# Patient Record
Sex: Female | Born: 1975 | Race: Black or African American | Hispanic: No | Marital: Single | Smoking: Current every day smoker
Health system: Southern US, Community
[De-identification: ages and names within clinical notes are randomized; demographics above are authoritative.]

## PROBLEM LIST (undated history)

## (undated) DIAGNOSIS — D649 Anemia, unspecified: Secondary | ICD-10-CM

## (undated) DIAGNOSIS — F329 Major depressive disorder, single episode, unspecified: Secondary | ICD-10-CM

## (undated) DIAGNOSIS — K5792 Diverticulitis of intestine, part unspecified, without perforation or abscess without bleeding: Secondary | ICD-10-CM

## (undated) DIAGNOSIS — F419 Anxiety disorder, unspecified: Secondary | ICD-10-CM

## (undated) DIAGNOSIS — M797 Fibromyalgia: Secondary | ICD-10-CM

## (undated) DIAGNOSIS — M1990 Unspecified osteoarthritis, unspecified site: Secondary | ICD-10-CM

## (undated) DIAGNOSIS — D219 Benign neoplasm of connective and other soft tissue, unspecified: Secondary | ICD-10-CM

## (undated) DIAGNOSIS — R112 Nausea with vomiting, unspecified: Secondary | ICD-10-CM

## (undated) DIAGNOSIS — R0600 Dyspnea, unspecified: Secondary | ICD-10-CM

## (undated) DIAGNOSIS — J45909 Unspecified asthma, uncomplicated: Secondary | ICD-10-CM

## (undated) DIAGNOSIS — F32A Depression, unspecified: Secondary | ICD-10-CM

## (undated) DIAGNOSIS — K219 Gastro-esophageal reflux disease without esophagitis: Secondary | ICD-10-CM

## (undated) DIAGNOSIS — Z9889 Other specified postprocedural states: Secondary | ICD-10-CM

## (undated) HISTORY — PX: CHOLECYSTECTOMY: SHX55

---
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
## 2017-04-16 ENCOUNTER — Other Ambulatory Visit: Payer: Self-pay | Admitting: *Deleted

## 2017-04-16 DIAGNOSIS — Z1231 Encounter for screening mammogram for malignant neoplasm of breast: Secondary | ICD-10-CM

## 2017-06-29 ENCOUNTER — Other Ambulatory Visit: Payer: Self-pay

## 2017-06-29 ENCOUNTER — Emergency Department (HOSPITAL_COMMUNITY)
Admission: EM | Admit: 2017-06-29 | Discharge: 2017-06-29 | Disposition: A | Discharge status: Home / Self Care | Payer: Self-pay | Attending: Emergency Medicine | Admitting: Emergency Medicine

## 2017-06-29 ENCOUNTER — Encounter (HOSPITAL_COMMUNITY): Payer: Self-pay | Admitting: Emergency Medicine

## 2017-06-29 ENCOUNTER — Emergency Department (HOSPITAL_COMMUNITY): Payer: Self-pay

## 2017-06-29 ENCOUNTER — Emergency Department (HOSPITAL_BASED_OUTPATIENT_CLINIC_OR_DEPARTMENT_OTHER): Admit: 2017-06-29 | Discharge: 2017-06-29 | Disposition: A | Discharge status: Home / Self Care | Payer: Self-pay

## 2017-06-29 DIAGNOSIS — M79604 Pain in right leg: Secondary | ICD-10-CM

## 2017-06-29 DIAGNOSIS — F17210 Nicotine dependence, cigarettes, uncomplicated: Secondary | ICD-10-CM | POA: Insufficient documentation

## 2017-06-29 DIAGNOSIS — J45909 Unspecified asthma, uncomplicated: Secondary | ICD-10-CM | POA: Insufficient documentation

## 2017-06-29 DIAGNOSIS — R52 Pain, unspecified: Secondary | ICD-10-CM

## 2017-06-29 DIAGNOSIS — M79609 Pain in unspecified limb: Secondary | ICD-10-CM

## 2017-06-29 HISTORY — DX: Diverticulitis of intestine, part unspecified, without perforation or abscess without bleeding: K57.92

## 2017-06-29 HISTORY — DX: Benign neoplasm of connective and other soft tissue, unspecified: D21.9

## 2017-06-29 HISTORY — DX: Anemia, unspecified: D64.9

## 2017-06-29 HISTORY — DX: Unspecified asthma, uncomplicated: J45.909

## 2017-06-29 HISTORY — DX: Gastro-esophageal reflux disease without esophagitis: K21.9

## 2017-06-29 LAB — CBC WITH DIFFERENTIAL/PLATELET
BASOS ABS: 0 10*3/uL (ref 0.0–0.1)
BASOS PCT: 1 %
Eosinophils Absolute: 0.1 10*3/uL (ref 0.0–0.7)
Eosinophils Relative: 2 %
HEMATOCRIT: 32.3 % — AB (ref 36.0–46.0)
Hemoglobin: 10.3 g/dL — ABNORMAL LOW (ref 12.0–15.0)
Lymphocytes Relative: 43 %
Lymphs Abs: 2.1 10*3/uL (ref 0.7–4.0)
MCH: 28.4 pg (ref 26.0–34.0)
MCHC: 31.9 g/dL (ref 30.0–36.0)
MCV: 89 fL (ref 78.0–100.0)
MONO ABS: 0.5 10*3/uL (ref 0.1–1.0)
Monocytes Relative: 10 %
NEUTROS ABS: 2.1 10*3/uL (ref 1.7–7.7)
NEUTROS PCT: 44 %
Platelets: 431 10*3/uL — ABNORMAL HIGH (ref 150–400)
RBC: 3.63 MIL/uL — ABNORMAL LOW (ref 3.87–5.11)
RDW: 17 % — AB (ref 11.5–15.5)
WBC: 4.9 10*3/uL (ref 4.0–10.5)

## 2017-06-29 LAB — BASIC METABOLIC PANEL
ANION GAP: 7 (ref 5–15)
BUN: 8 mg/dL (ref 6–20)
CALCIUM: 8.6 mg/dL — AB (ref 8.9–10.3)
CO2: 23 mmol/L (ref 22–32)
Chloride: 109 mmol/L (ref 101–111)
Creatinine, Ser: 0.58 mg/dL (ref 0.44–1.00)
GFR calc Af Amer: >60 mL/min (ref >60)
Glucose, Bld: 102 mg/dL — ABNORMAL HIGH (ref 65–99)
Potassium: 3.8 mmol/L (ref 3.5–5.1)
SODIUM: 139 mmol/L (ref 135–145)

## 2017-06-29 MED ORDER — METHOCARBAMOL 500 MG PO TABS: 500.0000 mg | ORAL_TABLET | Freq: Two times a day (BID) | ORAL | 0 refills | Status: DC

## 2017-06-29 NOTE — ED Notes (Signed)
Patient returned from XRay

## 2017-06-29 NOTE — ED Triage Notes (Signed)
Pt reports for the last 4-5 months has been having right leg pain and weakness. Pt states her leg has been giving out. Points to the thigh area as painful. Pt also c/o left leg pain. As would like her reflux medicine refilled. Pt ambulatory NAD.

## 2017-06-29 NOTE — ED Provider Notes (Signed)
[facility] EMERGENCY DEPARTMENT Provider Note   CSN: [id number] Arrival date & time: 06/29/17  0626     History   Chief Complaint Chief Complaint  Patient presents with  . Leg Pain    HPI Jessica Gallagher is a 42 y.o. female with PMH/o anemia, asthma, diverticulitis who presents for evaluation of right lower extremity pain that has been ongoing for the last 5 months.  She denies any preceding trauma, injury, fall.  Patient reports it is a sharp shooting pain down the anterior aspect of her right lower extremity.  She reports that most notably the pain is noted to the right anterior thigh. atient reports that she felt a small bump to that area over the last month. She reports that she has been able to work without any difficulty.  Patient reports that she is not taking any medications for the pain.  Patient reports that she has been able to walk without any difficulty.  Patient states that pain is worsened in severity and frequency over the last month, prompting ED visit.  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  Patient reports that at onset,  She had some numbness to her right foot but that improved. Denies any numbness now.  She has not had any overlying warmth, erythema.  Patient reports that she will intermittently also have pain in the left lower extremity but states that that is not bothering her as much today. Denies fevers, weight loss, numbness/weakness of upper and lower extremities, bowel/bladder incontinence, saddle anesthesia, history of back surgery, history of IVDA.  She denies any OCP use, recent immobilization, prior history of DVT/PE, recent surgery, leg swelling, or long travel.  The history is provided by the patient.    Past Medical History:  Diagnosis Date  . Anemia   . Asthma   . Diverticulitis   . Fibroid tumor   . GERD (gastroesophageal reflux disease)     There are no active  problems to display for this patient.   History reviewed. No pertinent surgical history.  OB History    No data available       Home Medications    Prior to Admission medications   Medication Sig Start Date End Date Taking? Authorizing Provider  methocarbamol (ROBAXIN) 500 MG tablet Take 1 tablet (500 mg total) by mouth 2 (two) times daily. 06/29/17   Volanda Napoleon, PA-C    Family History History reviewed. No pertinent family history.  Social History Social History   Tobacco Use  . Smoking status: Current Every Day Smoker    Packs/day: 0.50    Types: Cigarettes  Substance Use Topics  . Alcohol use: Yes    Comment: occasional  . Drug use: No     Allergies   Patient has no known allergies.   Review of Systems Review of Systems  Constitutional: Negative for chills and fever.  Respiratory: Negative for cough and shortness of breath.   Cardiovascular: Negative for chest pain.  Gastrointestinal: Negative for abdominal pain, diarrhea, nausea and vomiting.  Genitourinary: Negative for dysuria and hematuria.  Musculoskeletal: Negative for back pain and neck pain.       RLE pain  Skin: Negative for color change and rash.  Neurological: Negative for dizziness, weakness, numbness and headaches.  Psychiatric/Behavioral: Negative for confusion.     Physical Exam Updated Vital Signs BP 118/78 (BP Location: Right Arm)   Pulse 76  Temp 98.3 F (36.8 C) (Oral)   Resp 18   Ht 5\' 9"  (1.753 m)   Wt 86.2 kg (190 lb)   LMP 06/28/2017   SpO2 100%   BMI 28.06 kg/m   Physical Exam  Constitutional: She is oriented to person, place, and time. She appears well-developed and well-nourished.  HENT:  Head: Normocephalic and atraumatic.  Mouth/Throat: Oropharynx is clear and moist and mucous membranes are normal.  Eyes: Conjunctivae, EOM and lids are normal. Pupils are equal, round, and reactive to light.  Neck: Full passive range of motion without pain.  Cardiovascular:  Normal rate, regular rhythm, normal heart sounds and normal pulses. Exam reveals no gallop and no friction rub.  No murmur heard. Pulses:      Dorsalis pedis pulses are 2+ on the right side, and 2+ on the left side.  Pulmonary/Chest: Effort normal and breath sounds normal.  Abdominal: Soft. Normal appearance. There is no tenderness. There is no rigidity and no guarding.  Musculoskeletal: Normal range of motion.  Right lower extremity pain tenderness palpation overlying the anterior aspect of the right thigh.  No deformity or crepitus noted.  No palpable mass noted.  Flexion/extension of RLE intact without difficulty.  Some pain with internal and external rotation.  No abnormalities of the left lower extremity.  Neurological: She is alert and oriented to person, place, and time.  Follows commands, Moves all extremities  5/5 strength to BUE and BLE  Sensation intact throughout all major nerve distributions  Skin: Skin is warm and dry. Capillary refill takes less than 2 seconds.  Good distal cap refill.  RLE is not dusky in appearance or cool to touch.  Psychiatric: She has a normal mood and affect. Her speech is normal.  Nursing note and vitals reviewed.    ED Treatments / Results  Labs (all labs ordered are listed, but only abnormal results are displayed) Labs Reviewed  BASIC METABOLIC PANEL - Abnormal; Notable for the following components:      Result Value   Glucose, Bld 102 (*)    Calcium 8.6 (*)    All other components within normal limits  CBC WITH DIFFERENTIAL/PLATELET - Abnormal; Notable for the following components:   RBC 3.63 (*)    Hemoglobin 10.3 (*)    HCT 32.3 (*)    RDW 17.0 (*)    Platelets 431 (*)    All other components within normal limits  I-STAT BETA HCG BLOOD, ED ([facility], [facility], [facility] ONLY)    EKG  EKG Interpretation None       Radiology Dg Pelvis 1-2 Views  Result Date: 06/29/2017 CLINICAL DATA:  Shooting RIGHT hip pains down RIGHT leg with occasional RIGHT  leg numbness for 5 months EXAM: PELVIS - 1-2 VIEW COMPARISON:  None FINDINGS: Hip and SI joints symmetric and preserved. Mild subchondral cyst formation at the acetabular roof. No acute fracture, dislocation or additional bone destruction. Scattered pelvic phleboliths. IMPRESSION: Mild degenerative changes at the RIGHT hip joint. No acute bony abnormalities. Electronically Signed   By: Lavonia Dana M.D.   On: 06/29/2017 10:32   Dg Femur Min 2 Views Right  Result Date: 06/29/2017 CLINICAL DATA:  RIGHT hip joint pain with shooting pains down RIGHT leg with occasional RIGHT leg numbness for 5 months EXAM: RIGHT FEMUR 2 VIEWS COMPARISON:  None FINDINGS: Mild degenerative changes at RIGHT hip joint with small subchondral cysts at acetabular roof. Osseous mineralization otherwise normal. Knee joint alignment normal. No acute fracture, dislocation or  additional bone destruction. IMPRESSION: Mild degenerative changes at RIGHT hip joint. No RIGHT femoral abnormalities identified. Electronically Signed   By: Lavonia Dana M.D.   On: 06/29/2017 10:33    Procedures Procedures (including critical care time)  Medications Ordered in ED Medications - No data to display   Initial Impression / Assessment and Plan / ED Course  I have reviewed the triage vital signs and the nursing notes.  Pertinent labs & imaging results that were available during my care of the patient were reviewed by me and considered in my medical decision making (see chart for details).     42 y.o. F who presents for evaluation of RLE pain x 5 months. No redness/swelling. No preceding trauma, injury. Feels like there is a small bump on leg. Has been able to ambulate without difficulty. Patient is afebrile, non-toxic apearing, sitting comfortably on examination table. Vital signs reviewed and stable. No neuro deficits noted on exam. Patient is neurovascularly intact. Patient with tendenress to palpation to anterior thigh. No palpable mass noted.  No evidence of abscess. Consider muscular pain vs fracture vs dislocation vs arthritis. History/physical exam is not concerning for DVT, septic arthritis, acute arterial embolism. Plan to check XR, U/S for eval thrombophleblitis.   XR reviewed. There is mention of arthritis in hip which could be contributing to patient's pain.  Otherwise no acute bony abnormality.  Ultrasound reviewed.  Negative for any superficial thrombophlebitis, DVT.  Labs were unremarkable.  Discussed results with patient.  Patient has been able to ambulate in the department without any difficulty.  Patient still concerned that there may be a small bump to her leg.  On reexamination, there is a small tiny bump.  Does not appear to be abscess.  Unsure if this is a small lipoma or if a small area of inflammation.  Given reassuring workup, patient can follow-up with outpatient regarding further concerns.  Instructed patient to take NSAIDs for pain relief.  Encourage patient to follow-up with her primary care doctor. Patient had ample opportunity for questions and discussion. All patient's questions were answered with full understanding. Strict return precautions discussed. Patient expresses understanding and agreement to plan.    Final Clinical Impressions(s) / ED Diagnoses   Final diagnoses:  Right leg pain    ED Discharge Orders        Ordered    methocarbamol (ROBAXIN) 500 MG tablet  2 times daily     06/29/17 1250       Desma Mcgregor 06/30/17 2349    Gareth Morgan, MD 07/01/17 1310

## 2017-06-29 NOTE — Discharge Instructions (Addendum)
You can take Tylenol or Ibuprofen as directed for pain. You can alternate Tylenol and Ibuprofen every 4 hours. If you take Tylenol at 1pm, then you can take Ibuprofen at 5pm. Then you can take Tylenol again at 9pm.   Take Robaxin as prescribed. This medication will make you drowsy so do not drive or drink alcohol when taking it.  You can apply ice to the affected area.   We discussed, you need to follow-up with primary care doctor.  You can follow-up with [facility] for further evaluation.  Return to the emergency department for any worsening pain, redness or swelling of the leg, fever, difficulty breathing, difficulty moving the leg, numbness in the leg, difficulty walking or any other worsening or concerning symptoms.   As we discussed, you need to follow-up with the [facility]   As we discussed, you need to follow-up with your primary care doctor.  I have arran

## 2017-06-29 NOTE — Progress Notes (Signed)
Preliminary results by tech - Venous Duplex Lower Ext. Completed. Negative for deep and superficial vein thrombosis. Benedict Kue, BS, RDMS, RVT  

## 2018-04-27 HISTORY — PX: ABDOMINAL HYSTERECTOMY: SHX81

## 2018-08-22 ENCOUNTER — Encounter (HOSPITAL_COMMUNITY): Payer: Self-pay

## 2018-08-22 ENCOUNTER — Emergency Department (HOSPITAL_COMMUNITY)
Admission: EM | Admit: 2018-08-22 | Discharge: 2018-08-22 | Disposition: A | Discharge status: Home / Self Care | Payer: BC Managed Care – PPO | Attending: Emergency Medicine | Admitting: Emergency Medicine

## 2018-08-22 ENCOUNTER — Emergency Department (HOSPITAL_COMMUNITY): Payer: BC Managed Care – PPO

## 2018-08-22 ENCOUNTER — Other Ambulatory Visit: Payer: Self-pay

## 2018-08-22 DIAGNOSIS — D259 Leiomyoma of uterus, unspecified: Secondary | ICD-10-CM | POA: Insufficient documentation

## 2018-08-22 DIAGNOSIS — N858 Other specified noninflammatory disorders of uterus: Secondary | ICD-10-CM | POA: Diagnosis not present

## 2018-08-22 DIAGNOSIS — J45909 Unspecified asthma, uncomplicated: Secondary | ICD-10-CM | POA: Diagnosis not present

## 2018-08-22 DIAGNOSIS — F17210 Nicotine dependence, cigarettes, uncomplicated: Secondary | ICD-10-CM | POA: Diagnosis not present

## 2018-08-22 DIAGNOSIS — R102 Pelvic and perineal pain: Secondary | ICD-10-CM | POA: Diagnosis not present

## 2018-08-22 DIAGNOSIS — N939 Abnormal uterine and vaginal bleeding, unspecified: Secondary | ICD-10-CM | POA: Diagnosis present

## 2018-08-22 DIAGNOSIS — D219 Benign neoplasm of connective and other soft tissue, unspecified: Secondary | ICD-10-CM

## 2018-08-22 LAB — WET PREP, GENITAL
Clue Cells Wet Prep HPF POC: NONE SEEN
Sperm: NONE SEEN
Trich, Wet Prep: NONE SEEN
Yeast Wet Prep HPF POC: NONE SEEN

## 2018-08-22 LAB — CBC
HCT: 30.1 % — ABNORMAL LOW (ref 36.0–46.0)
Hemoglobin: 9.6 g/dL — ABNORMAL LOW (ref 12.0–15.0)
MCH: 28.7 pg (ref 26.0–34.0)
MCHC: 31.9 g/dL (ref 30.0–36.0)
MCV: 89.9 fL (ref 80.0–100.0)
Platelets: 397 10*3/uL (ref 150–400)
RBC: 3.35 MIL/uL — ABNORMAL LOW (ref 3.87–5.11)
RDW: 16.1 % — ABNORMAL HIGH (ref 11.5–15.5)
WBC: 10.3 10*3/uL (ref 4.0–10.5)
nRBC: 0 % (ref 0.0–0.2)

## 2018-08-22 LAB — BASIC METABOLIC PANEL
Anion gap: 11 (ref 5–15)
BUN: <5 mg/dL — ABNORMAL LOW (ref 6–20)
CO2: 23 mmol/L (ref 22–32)
Calcium: 9.2 mg/dL (ref 8.9–10.3)
Chloride: 105 mmol/L (ref 98–111)
Creatinine, Ser: 0.68 mg/dL (ref 0.44–1.00)
GFR calc Af Amer: >60 mL/min (ref >60)
GFR calc non Af Amer: >60 mL/min (ref >60)
Glucose, Bld: 123 mg/dL — ABNORMAL HIGH (ref 70–99)
Potassium: 3.6 mmol/L (ref 3.5–5.1)
Sodium: 139 mmol/L (ref 135–145)

## 2018-08-22 LAB — PREGNANCY, URINE: Preg Test, Ur: NEGATIVE

## 2018-08-22 LAB — I-STAT BETA HCG BLOOD, ED (MC, WL, AP ONLY): I-stat hCG, quantitative: <5 m[IU]/mL (ref <5)

## 2018-08-22 MED ORDER — MEGESTROL ACETATE 40 MG PO TABS
40.0000 mg | ORAL_TABLET | Freq: Once | ORAL | Status: AC
  Administered 2018-08-22: 40 mg via ORAL
  Filled 2018-08-22: qty 1

## 2018-08-22 MED ORDER — MEGESTROL ACETATE 40 MG PO TABS: 40.0000 mg | ORAL_TABLET | Freq: Two times a day (BID) | ORAL | 0 refills | Status: AC

## 2018-08-22 NOTE — ED Notes (Signed)
Patient transported to US 

## 2018-08-22 NOTE — ED Provider Notes (Signed)
Care assumed from Dr. Brenton Grills as patient was transferred to yellow zone.  Please see his note for full details, but in brief Jessica Gallagher is a 43 y.o. female who presents for abnormal vaginal bleeding is been occurring for a few months but worse over the past 2 weeks.  Known history of fibroids.  Labs and pelvic ultrasound ordered.  Patient has not been trialed on any hormone therapy for bleeding.  Plan: Follow-up on labs and ultrasound to assess for any endometrial mass as cause for bleeding, suspect this may be related to early menopause versus fibroids. Physical Exam  BP 123/73 (BP Location: Left Arm)   Pulse 87   Temp 98.3 F (36.8 C) (Oral)   Resp 16   Ht 5\' 9"  (1.753 m)   Wt 82.6 kg   LMP  (Within Weeks)   SpO2 100%   BMI 26.88 kg/m   Physical Exam Vitals signs and nursing note reviewed.  Constitutional:      General: She is not in acute distress.    Appearance: Normal appearance. She is well-developed and normal weight. She is not diaphoretic.     Comments: Well-appearing and in no acute distress.  HENT:     Head: Normocephalic and atraumatic.  Eyes:     General:        Right eye: No discharge.        Left eye: No discharge.  Pulmonary:     Effort: Pulmonary effort is normal. No respiratory distress.  Genitourinary:    Comments: GU exam per previous provider. Neurological:     Mental Status: She is alert.     Coordination: Coordination normal.  Psychiatric:        Mood and Affect: Mood normal.        Behavior: Behavior normal.     ED Course/Procedures   Labs Reviewed  WET PREP, GENITAL - Abnormal; Notable for the following components:      Result Value   WBC, Wet Prep HPF POC MANY (*)    All other components within normal limits  CBC - Abnormal; Notable for the following components:   RBC 3.35 (*)    Hemoglobin 9.6 (*)    HCT 30.1 (*)    RDW 16.1 (*)    All other components within normal limits  BASIC METABOLIC PANEL - Abnormal; Notable for the  following components:   Glucose, Bld 123 (*)    BUN <5 (*)    All other components within normal limits  PREGNANCY, URINE  I-STAT BETA HCG BLOOD, ED ([facility], [facility], [facility] ONLY)  GC/CHLAMYDIA PROBE AMP ([place]) NOT AT [facility]   US Transvaginal Non-ob  Result Date: 08/22/2018 CLINICAL DATA:  43 year old female with abnormal uterine bleeding for 1 month. Negative pregnancy test. EXAM: TRANSABDOMINAL AND TRANSVAGINAL ULTRASOUND OF PELVIS TECHNIQUE: Both transabdominal and transvaginal ultrasound examinations of the pelvis were performed. Transabdominal technique was performed for global imaging of the pelvis including uterus, ovaries, adnexal regions, and pelvic cul-de-sac. It was necessary to proceed with endovaginal exam following the transabdominal exam to visualize the ovaries and endometrium. COMPARISON:  None FINDINGS: Uterus Measurements: 12.1 x 6.3 x 0.4 cm = volume: 335 mL. Multiple masses/fibroids are identified as follows: A 3.5 x 2.9 x 2.7 cm anterior intramural LEFT uterine body fibroid A 3.5 x 3 x 2.6 cm posterior RIGHT uterine body fibroid A 4.3 x 4 x 3.4 cm subserosal fundal fibroid Endometrium Thickness: 22 mm. A 2.2 x 2.1 x 1.9 cm hypoechoic mass  obscuring the endometrial stripe towards the fundus is noted and difficult to determine if this represents endometrial mass or submucosal fibroid. Right ovary Measurements: 3.3 x 1.9 x 2.8 cm = volume: 9 mL. Normal appearance/no adnexal mass. Left ovary Measurements: 2.8 x 1.9 x 2.7 cm = volume: 7 mL. A 2.2 x 1.9 cm probable corpus luteum noted. Normal appearance/no adnexal mass. Other findings No abnormal free fluid. IMPRESSION: 1. 2.2 x 2.1 x 1.9 cm hypoechoic mass obscuring the endometrial stripe towards the fundus - question endometrial mass versus submucosal fibroid. 2. Multiple uterine fibroids as described. 3. No significant ovarian findings. 4. No free fluid or adnexal mass. Electronically Signed   By: Margarette Canada M.D.   On: 08/22/2018 15:09   US  Pelvis Complete  Result Date: 08/22/2018 CLINICAL DATA:  43 year old female with abnormal uterine bleeding for 1 month. Negative pregnancy test. EXAM: TRANSABDOMINAL AND TRANSVAGINAL ULTRASOUND OF PELVIS TECHNIQUE: Both transabdominal and transvaginal ultrasound examinations of the pelvis were performed. Transabdominal technique was performed for global imaging of the pelvis including uterus, ovaries, adnexal regions, and pelvic cul-de-sac. It was necessary to proceed with endovaginal exam following the transabdominal exam to visualize the ovaries and endometrium. COMPARISON:  None FINDINGS: Uterus Measurements: 12.1 x 6.3 x 0.4 cm = volume: 335 mL. Multiple masses/fibroids are identified as follows: A 3.5 x 2.9 x 2.7 cm anterior intramural LEFT uterine body fibroid A 3.5 x 3 x 2.6 cm posterior RIGHT uterine body fibroid A 4.3 x 4 x 3.4 cm subserosal fundal fibroid Endometrium Thickness: 22 mm. A 2.2 x 2.1 x 1.9 cm hypoechoic mass obscuring the endometrial stripe towards the fundus is noted and difficult to determine if this represents endometrial mass or submucosal fibroid. Right ovary Measurements: 3.3 x 1.9 x 2.8 cm = volume: 9 mL. Normal appearance/no adnexal mass. Left ovary Measurements: 2.8 x 1.9 x 2.7 cm = volume: 7 mL. A 2.2 x 1.9 cm probable corpus luteum noted. Normal appearance/no adnexal mass. Other findings No abnormal free fluid. IMPRESSION: 1. 2.2 x 2.1 x 1.9 cm hypoechoic mass obscuring the endometrial stripe towards the fundus - question endometrial mass versus submucosal fibroid. 2. Multiple uterine fibroids as described. 3. No significant ovarian findings. 4. No free fluid or adnexal mass. Electronically Signed   By: Margarette Canada M.D.   On: 08/22/2018 15:09    Procedures  MDM   Patient presents for evaluation of abnormal uterine bleeding, worsening over the past 2 weeks.  Known history of fibroids.  History of chronic anemia and hemoglobin today is 9.6, no leukocytosis, normal  electrolytes and renal function.  Wet prep with many WBCs but no other findings and patient is not having vaginal discharge associated with bleeding, GC and chlamydia pending.  Ultrasound shows multiple uterine fibroids, there is a small 2 x 2 centimeter mass within the endometrial stripe question this being an endometrial mass versus a submucosal fibroid.  I discussed ultrasound findings with Dr. Elly Modena on-call for OB/GYN who recommends starting patient on [name] 40 twice daily, OB/GYN follow-up for possible endometrial biopsy.  I discussed this plan with the patient she expresses understanding and agreement with plan.  Provided first dose of [name] here.  Return precautions discussed.  Discharged home in good condition.  Final diagnoses:  Abnormal uterine bleeding  Uterine mass  Fibroids   ED Discharge Orders         Ordered    megestrol ([name]) 40 MG tablet  2 times daily  08/22/18 1539              Jacqlyn Larsen, PA-C 08/22/18 1605    Maudie Flakes, MD 08/23/18 1109

## 2018-08-22 NOTE — ED Triage Notes (Signed)
Pt arrives POV, for vaginal bleeding. Pt had period earlier this month. Pt again had bleeding starting last Wednesday with heavy bleeding and passing clots. Pt reports hx of fibroids. Pt having mild abdominal pain.

## 2018-08-22 NOTE — ED Notes (Signed)
Patient Alert and oriented to baseline. Stable and ambulatory to baseline. Patient verbalized understanding of the discharge instructions.  Patient belongings were taken by the patient.   

## 2018-08-22 NOTE — ED Provider Notes (Signed)
[facility] Emergency Department Provider Note MRN:  [id number]  Arrival date & time: 08/22/18     Chief Complaint   Vaginal Bleeding   History of Present Illness   Jessica Gallagher is a 43 y.o. year-old female with a history of fibroids vaginal bleeding presenting to the ED with chief complaint of vaginal bleeding.  Abnormal uterine bleeding for the past several months.  Bleeding between her periods, which fluctuate in length and severity.  2 weeks ago, woke up and found that she had soiled her entire bed with vaginal blood.  Has continued to have a off schedule heavy period flow since that time.  Denies vaginal discharge or pain, no headache or vision change, no lightheadedness, no chest pain or shortness of breath, no abdominal pain.  Reports a history of fibroids.  Symptoms are constant, moderate, no exacerbating relieving factors.  Review of Systems  A complete 10 system review of systems was obtained and all systems are negative except as noted in the HPI and PMH.   Patient's Health History    Past Medical History:  Diagnosis Date  . Anemia   . Asthma   . Diverticulitis   . Fibroid tumor   . GERD (gastroesophageal reflux disease)     History reviewed. No pertinent surgical history.  History reviewed. No pertinent family history.  Social History   Socioeconomic History  . Marital status: Single    Spouse name: Not on file  . Number of children: Not on file  . Years of education: Not on file  . Highest education level: Not on file  Occupational History  . Not on file  Social Needs  . Financial resource strain: Not on file  . Food insecurity:    Worry: Not on file    Inability: Not on file  . Transportation needs:    Medical: Not on file    Non-medical: Not on file  Tobacco Use  . Smoking status: Current Every Day Smoker    Packs/day: 0.50    Types: Cigarettes  Substance and Sexual Activity  . Alcohol use: Yes    Comment: occasional  . Drug use:  No  . Sexual activity: Yes    Birth control/protection: None  Lifestyle  . Physical activity:    Days per week: Not on file    Minutes per session: Not on file  . Stress: Not on file  Relationships  . Social connections:    Talks on phone: Not on file    Gets together: Not on file    Attends religious service: Not on file    Active member of club or organization: Not on file    Attends meetings of clubs or organizations: Not on file    Relationship status: Not on file  . Intimate partner violence:    Fear of current or ex partner: Not on file    Emotionally abused: Not on file    Physically abused: Not on file    Forced sexual activity: Not on file  Other Topics Concern  . Not on file  Social History Narrative  . Not on file     Physical Exam  Vital Signs and Nursing Notes reviewed Vitals:   08/22/18 1330 08/22/18 1606  BP: 113/69 121/77  Pulse: 84 70  Resp: 16 18  Temp:  98.1 F (36.7 C)  SpO2: 100% 99%    CONSTITUTIONAL: Well-appearing, NAD NEURO:  Alert and oriented x 3, no focal deficits EYES:  eyes equal  and reactive ENT/NECK:  no LAD, no JVD CARDIO: Regular rate, well-perfused, normal S1 and S2 PULM:  CTAB no wheezing or rhonchi GI/GU:  normal bowel sounds, non-distended, non-tender; normal-appearing external genitalia, no lymphadenopathy, no adnexal masses or tenderness, no cervical motion tenderness, moderate blood in vaginal vault, no vaginal abrasions, exam chaperoned by female nurse MSK/SPINE:  No gross deformities, no edema SKIN:  no rash, atraumatic PSYCH:  Appropriate speech and behavior  Diagnostic and Interventional Summary    Labs Reviewed  WET PREP, GENITAL - Abnormal; Notable for the following components:      Result Value   WBC, Wet Prep HPF POC MANY (*)    All other components within normal limits  CBC - Abnormal; Notable for the following components:   RBC 3.35 (*)    Hemoglobin 9.6 (*)    HCT 30.1 (*)    RDW 16.1 (*)    All other  components within normal limits  BASIC METABOLIC PANEL - Abnormal; Notable for the following components:   Glucose, Bld 123 (*)    BUN <5 (*)    All other components within normal limits  PREGNANCY, URINE  I-STAT BETA HCG BLOOD, ED ([facility], [facility], [facility] ONLY)  GC/CHLAMYDIA PROBE AMP ([place]) NOT AT [facility]    US Pelvis Complete  Final Result    US Transvaginal Non-OB  Final Result      Medications  megestrol ([name]) tablet 40 mg (40 mg Oral Given 08/22/18 1614)     Procedures Critical Care  ED Course and Medical Decision Making  I have reviewed the triage vital signs and the nursing notes.  Pertinent labs & imaging results that were available during my care of the patient were reviewed by me and considered in my medical decision making (see below for details).  According to chart review, patient has seen her PCP and they have been attempting to schedule outpatient ultrasound to reevaluate her fibroids.  Will obtain today, as well as screening labs given that her hemoglobin back in March was 10 and she is continued to bleed off schedule.  With a reassuring H&H and a nonemergent ultrasound, would consider [name] and calling GYN to ensure proper follow-up.  Care handed off to provider Benedetto Goad.  Barth Kirks. Sedonia Small, MD [place] [email or web address]  Final Clinical Impressions(s) / ED Diagnoses     ICD-10-CM   1. Abnormal uterine bleeding N93.9   2. Uterine mass N85.8   3. Fibroids D21.9     ED Discharge Orders         Ordered    megestrol ([name]) 40 MG tablet  2 times daily     08/22/18 1539             Maudie Flakes, MD 08/22/18 [phone]

## 2018-08-22 NOTE — ED Notes (Signed)
ED Provider at bedside. 

## 2018-08-22 NOTE — Discharge Instructions (Signed)
Please take [name] twice daily for the next 10 days to help with bleeding.  You need to follow-up with OB/GYN for valuation of bleeding and possible endometrial mass found on ultrasound today in addition to multiple uterine fibroids.  If you have heavier bleeding despite this medication began feeling lightheaded, pass out have chest pain, shortness of breath or any other new or concerning symptoms you can return for reevaluation.

## 2018-08-22 NOTE — ED Notes (Signed)
Handoff report received from Jeanine Luz, Therapist, sports.

## 2018-08-23 LAB — GC/CHLAMYDIA PROBE AMP (~~LOC~~) NOT AT ARMC
Chlamydia: NEGATIVE
Neisseria Gonorrhea: NEGATIVE

## 2018-10-19 ENCOUNTER — Other Ambulatory Visit: Payer: Self-pay | Admitting: Obstetrics and Gynecology

## 2018-10-19 DIAGNOSIS — R109 Unspecified abdominal pain: Secondary | ICD-10-CM

## 2018-10-20 ENCOUNTER — Emergency Department (HOSPITAL_COMMUNITY)
Admission: EM | Admit: 2018-10-20 | Discharge: 2018-10-20 | Disposition: A | Discharge status: Home / Self Care | Payer: BC Managed Care – PPO | Attending: Emergency Medicine | Admitting: Emergency Medicine

## 2018-10-20 ENCOUNTER — Emergency Department (HOSPITAL_COMMUNITY): Payer: BC Managed Care – PPO

## 2018-10-20 ENCOUNTER — Other Ambulatory Visit: Payer: Self-pay

## 2018-10-20 DIAGNOSIS — N939 Abnormal uterine and vaginal bleeding, unspecified: Secondary | ICD-10-CM

## 2018-10-20 DIAGNOSIS — A599 Trichomoniasis, unspecified: Secondary | ICD-10-CM | POA: Insufficient documentation

## 2018-10-20 DIAGNOSIS — R1030 Lower abdominal pain, unspecified: Secondary | ICD-10-CM | POA: Insufficient documentation

## 2018-10-20 DIAGNOSIS — F17210 Nicotine dependence, cigarettes, uncomplicated: Secondary | ICD-10-CM | POA: Insufficient documentation

## 2018-10-20 DIAGNOSIS — Z79899 Other long term (current) drug therapy: Secondary | ICD-10-CM | POA: Insufficient documentation

## 2018-10-20 DIAGNOSIS — D219 Benign neoplasm of connective and other soft tissue, unspecified: Secondary | ICD-10-CM | POA: Insufficient documentation

## 2018-10-20 LAB — CBC WITH DIFFERENTIAL/PLATELET
Abs Immature Granulocytes: 0.01 10*3/uL (ref 0.00–0.07)
Basophils Absolute: 0.1 10*3/uL (ref 0.0–0.1)
Basophils Relative: 2 %
Eosinophils Absolute: 0.1 10*3/uL (ref 0.0–0.5)
Eosinophils Relative: 2 %
HCT: 33.6 % — ABNORMAL LOW (ref 36.0–46.0)
Hemoglobin: 11 g/dL — ABNORMAL LOW (ref 12.0–15.0)
Immature Granulocytes: 0 %
Lymphocytes Relative: 53 %
Lymphs Abs: 2.2 10*3/uL (ref 0.7–4.0)
MCH: 30.3 pg (ref 26.0–34.0)
MCHC: 32.7 g/dL (ref 30.0–36.0)
MCV: 92.6 fL (ref 80.0–100.0)
Monocytes Absolute: 0.5 10*3/uL (ref 0.1–1.0)
Monocytes Relative: 13 %
Neutro Abs: 1.3 10*3/uL — ABNORMAL LOW (ref 1.7–7.7)
Neutrophils Relative %: 30 %
Platelets: 369 10*3/uL (ref 150–400)
RBC: 3.63 MIL/uL — ABNORMAL LOW (ref 3.87–5.11)
RDW: 17.7 % — ABNORMAL HIGH (ref 11.5–15.5)
WBC: 4.1 10*3/uL (ref 4.0–10.5)
nRBC: 0 % (ref 0.0–0.2)

## 2018-10-20 LAB — PREGNANCY, URINE: Preg Test, Ur: NEGATIVE

## 2018-10-20 LAB — WET PREP, GENITAL
Clue Cells Wet Prep HPF POC: NONE SEEN
Sperm: NONE SEEN
Yeast Wet Prep HPF POC: NONE SEEN

## 2018-10-20 LAB — BASIC METABOLIC PANEL
Anion gap: 10 (ref 5–15)
BUN: 8 mg/dL (ref 6–20)
CO2: 20 mmol/L — ABNORMAL LOW (ref 22–32)
Calcium: 8.9 mg/dL (ref 8.9–10.3)
Chloride: 109 mmol/L (ref 98–111)
Creatinine, Ser: 0.68 mg/dL (ref 0.44–1.00)
GFR calc Af Amer: >60 mL/min (ref >60)
GFR calc non Af Amer: >60 mL/min (ref >60)
Glucose, Bld: 104 mg/dL — ABNORMAL HIGH (ref 70–99)
Potassium: 4 mmol/L (ref 3.5–5.1)
Sodium: 139 mmol/L (ref 135–145)

## 2018-10-20 LAB — URINALYSIS, ROUTINE W REFLEX MICROSCOPIC
Bacteria, UA: NONE SEEN
Bilirubin Urine: NEGATIVE
Glucose, UA: NEGATIVE mg/dL
Ketones, ur: NEGATIVE mg/dL
Leukocytes,Ua: NEGATIVE
Nitrite: NEGATIVE
Protein, ur: 30 mg/dL — AB
RBC / HPF: >50 RBC/hpf — ABNORMAL HIGH (ref 0–5)
Specific Gravity, Urine: 1.025 (ref 1.005–1.030)
pH: 5 (ref 5.0–8.0)

## 2018-10-20 LAB — TYPE AND SCREEN
ABO/RH(D): O NEG
Antibody Screen: NEGATIVE

## 2018-10-20 LAB — ABO/RH: ABO/RH(D): O NEG

## 2018-10-20 MED ORDER — METRONIDAZOLE 500 MG PO TABS
2000.0000 mg | ORAL_TABLET | Freq: Once | ORAL | Status: AC
  Administered 2018-10-20: 17:00:00 1000 mg via ORAL
  Filled 2018-10-20: qty 4

## 2018-10-20 MED ORDER — IOHEXOL 300 MG/ML  SOLN
100.0000 mL | Freq: Once | INTRAMUSCULAR | Status: AC | PRN
  Administered 2018-10-20: 100 mL via INTRAVENOUS

## 2018-10-20 MED ORDER — METRONIDAZOLE 500 MG PO TABS
1000.0000 mg | ORAL_TABLET | Freq: Once | ORAL | Status: AC
  Administered 2018-10-20: 1000 mg via ORAL
  Filled 2018-10-20: qty 2

## 2018-10-20 MED ORDER — ONDANSETRON HCL 4 MG/2ML IJ SOLN
4.0000 mg | Freq: Once | INTRAMUSCULAR | Status: AC
  Administered 2018-10-20: 4 mg via INTRAVENOUS
  Filled 2018-10-20: qty 2

## 2018-10-20 NOTE — Discharge Instructions (Addendum)
You were seen in the ED today for worsening vaginal bleeding; your labwork was reassuring today. You were found to have trichomonas during  your pelvic exam; we have treated this in the ED. Please refrain from sex for the next 10 days. We have sent out gonorrhea and chlamydia tests; these take 2-3 days to return. Your CT scan showed uterine fibroids but no other concerning findings; please follow up with your OBGYN regarding your ED visit and your CT scan. They will need to work you up further.

## 2018-10-20 NOTE — ED Notes (Addendum)
Pt did NOT have the I-stat Beta hCG blood test performed. There was two orders put in however, it was never ran. The pregnancy test was added to the previously collected urine specimen to clarify/deny pregnancy before CT scan was performed. The order was "completed" after the blood was sent down to the lab primarily, so it cannot be "discontinued." This note is clarify that the lab was not completed.

## 2018-10-20 NOTE — ED Triage Notes (Signed)
Pt has had intermittent vaginal bleeding/abdominal cramping for approx. 2 months (per pt). She stated that she had an abd [place] that found a "mass" & she recently has a CT scan scheduled (within these past two months, but has not done performed yet). She has returned to the ED today due to increased bleeding the past two days that used up a whole pack of feminine pads & she states that she feels "pressure" in her lower belly, like "contractions" & the clots she is noticing is more often & now larger than normal. Upon arrival to ED she was tearful & vomiting.

## 2018-10-20 NOTE — ED Provider Notes (Signed)
[facility] EMERGENCY DEPARTMENT Provider Note   CSN: [id number] Arrival date & time: 10/20/18  1056    History   Chief Complaint Chief Complaint  Patient presents with   Abdominal Pain   Vaginal Bleeding    HPI Jessica Gallagher is a 43 y.o. female with PMHx asthma, diverticulitis, uterine fibroids who presents to the ED complaining of worsening vaginal bleeding x 2 days with lower abdominal cramping. Pt reports she has been bleeding constantly for the past ~ 2 months; she was seen in the ED on 04/27 for same with findings on pelvic ultrasound: 1. 2.2 x 2.1 x 1.9 cm hypoechoic mass obscuring the endometrial stripe towards the fundus - question endometrial mass versus submucosal fibroid. Pt was discharged home with Rx [name] and given referral to OBGYN. Pt reports the megace stopped the bleeding for a couple of days but then it returned; she was seen by OBGYN on 06/17 for same and told to switch from megace to progesterone; pt states the progesterone made her feel "weird" so she called her OBGYN's office yesterday and asked to be switched to megace again. Pt has an outpatient CT A/P scheduled for 07/07 to evaluate mass further. She states she could not wait that long due to the heavy bleeding she has been experiencing for the past 2 days; she states she has gone through a hole box of heavy pads in a 24 hr period.        Past Medical History:  Diagnosis Date   Anemia    Asthma    Diverticulitis    Fibroid tumor    GERD (gastroesophageal reflux disease)     There are no active problems to display for this patient.   No past surgical history on file.   OB History   No obstetric history on file.      Home Medications    Prior to Admission medications   Medication Sig Start Date End Date Taking? Authorizing Provider  acetaminophen (TYLENOL) 500 MG tablet Take 1,000 mg by mouth every 6 (six) hours as needed for mild pain.   Yes [provider]    methocarbamol (ROBAXIN) 500 MG tablet Take 1 tablet (500 mg total) by mouth 2 (two) times daily. 06/29/17  Yes Volanda Napoleon, PA-C  ondansetron (ZOFRAN) 8 MG tablet Take 8 mg by mouth every 8 (eight) hours as needed for nausea or vomiting.  10/12/18  Yes [provider]  dicyclomine (BENTYL) 10 MG capsule Take 10 mg by mouth 3 (three) times daily as needed. 08/18/18 09/17/18  [provider]    Family History No family history on file.  Social History Social History   Tobacco Use   Smoking status: Current Every Day Smoker    Packs/day: 0.50    Types: Cigarettes  Substance Use Topics   Alcohol use: Yes    Comment: occasional   Drug use: No     Allergies   Patient has no known allergies.   Review of Systems Review of Systems  Constitutional: Negative for chills and fever.  HENT: Negative for congestion.   Eyes: Negative for visual disturbance.  Respiratory: Negative for cough and shortness of breath.   Cardiovascular: Negative for chest pain.  Gastrointestinal: Positive for abdominal pain and nausea. Negative for constipation, diarrhea and vomiting.  Genitourinary: Positive for menstrual problem and vaginal bleeding. Negative for pelvic pain and vaginal discharge.  Musculoskeletal: Negative for myalgias.  Skin: Negative for rash.  Neurological: Positive for light-headedness.  Negative for syncope and headaches.     Physical Exam Updated Vital Signs BP 111/71    Pulse 79    Ht 5\' 9"  (1.753 m)    Wt 88 kg    SpO2 99%    BMI 28.65 kg/m   Physical Exam Vitals signs and nursing note reviewed.  Constitutional:      Appearance: She is not ill-appearing.  HENT:     Head: Normocephalic and atraumatic.  Eyes:     Conjunctiva/sclera: Conjunctivae normal.  Neck:     Musculoskeletal: Neck supple.  Cardiovascular:     Rate and Rhythm: Normal rate and regular rhythm.  Pulmonary:     Effort: Pulmonary effort is normal.     Breath sounds: Normal breath  sounds.  Abdominal:     Palpations: Abdomen is soft.     Tenderness: There is abdominal tenderness in the right lower quadrant, suprapubic area and left lower quadrant. There is no guarding or rebound. Negative signs include McBurney's sign.  Genitourinary:    Comments: Chaperone present for exam. No rashes, lesions, or tenderness to external genitalia. No erythema, injury, or tenderness to vaginal mucosa. Large amount of blood in vaginal vault coming from Os. No adnexal masses, tenderness, or fullness. No CMT, cervical friability, or discharge from cervical os. Cervical os is closed.   Skin:    General: Skin is warm and dry.  Neurological:     Mental Status: She is alert.      ED Treatments / Results  Labs (all labs ordered are listed, but only abnormal results are displayed) Labs Reviewed  WET PREP, GENITAL - Abnormal; Notable for the following components:      Result Value   Trich, Wet Prep PRESENT (*)    WBC, Wet Prep HPF POC FEW (*)    All other components within normal limits  CBC WITH DIFFERENTIAL/PLATELET - Abnormal; Notable for the following components:   RBC 3.63 (*)    Hemoglobin 11.0 (*)    HCT 33.6 (*)    RDW 17.7 (*)    Neutro Abs 1.3 (*)    All other components within normal limits  BASIC METABOLIC PANEL - Abnormal; Notable for the following components:   CO2 20 (*)    Glucose, Bld 104 (*)    All other components within normal limits  URINALYSIS, ROUTINE W REFLEX MICROSCOPIC - Abnormal; Notable for the following components:   APPearance HAZY (*)    Hgb urine dipstick LARGE (*)    Protein, ur 30 (*)    RBC / HPF >50 (*)    All other components within normal limits  PREGNANCY, URINE  RPR  HIV ANTIBODY (ROUTINE TESTING W REFLEX)  I-STAT BETA HCG BLOOD, ED ([facility], [facility], [facility] ONLY)  I-STAT BETA HCG BLOOD, ED ([facility], [facility], [facility] ONLY)  TYPE AND SCREEN  ABO/RH  GC/CHLAMYDIA PROBE AMP ([facility]) NOT AT [facility]    EKG None  Radiology Ct Abdomen Pelvis W  Contrast  Result Date: 10/20/2018 CLINICAL DATA:  Intermittent vaginal bleeding and abdominal cramping EXAM: CT ABDOMEN AND PELVIS WITH CONTRAST TECHNIQUE: Multidetector CT imaging of the abdomen and pelvis was performed using the standard protocol following bolus administration of intravenous contrast. CONTRAST:  136mL OMNIPAQUE IOHEXOL 300 MG/ML  SOLN COMPARISON:  Pelvic ultrasound 08/22/2018 FINDINGS: Lower chest: Lung bases demonstrate no acute consolidation or effusion. The heart size is normal Hepatobiliary: No focal liver abnormality is seen. Status post cholecystectomy. No biliary dilatation. Pancreas: Unremarkable. No pancreatic ductal dilatation  or surrounding inflammatory changes. Spleen: Normal in size without focal abnormality. Adrenals/Urinary Tract: Adrenal glands are normal. Branching hyperdensities in the kidneys felt secondary to early excretion of contrast. No hydronephrosis. Bladder normal. Stomach/Bowel: Stomach is within normal limits. Appendix appears normal. No evidence of bowel wall thickening, distention, or inflammatory changes. Sigmoid and descending colon diverticular disease without acute inflammatory change. Vascular/Lymphatic: Nonaneurysmal aorta. No significantly enlarged lymph nodes Reproductive: Enlarged lobulated uterus with multiple hypoenhancing masses. No obvious adnexal mass. Other: Negative for free air or free fluid. Musculoskeletal: No acute or suspicious osseous lesion. Degenerative changes of the lumbar spine. IMPRESSION: 1. No CT evidence for acute intra-abdominal or pelvic abnormality. 2. Enlarged lobulated uterus with multiple hypoenhancing masses, likely corresponding to multiple fibroids noted on ultrasound. 3. Left colon diverticular disease without acute inflammatory process Electronically Signed   By: Donavan Foil M.D.   On: 10/20/2018 17:07    Procedures Procedures (including critical care time)  Medications Ordered in ED Medications  ondansetron  (ZOFRAN) injection 4 mg (4 mg Intravenous Given 10/20/18 1220)  metroNIDAZOLE (FLAGYL) tablet 2,000 mg (1,000 mg Oral Given 10/20/18 1649)  iohexol (OMNIPAQUE) 300 MG/ML solution 100 mL (100 mLs Intravenous Contrast Given 10/20/18 1639)     Initial Impression / Assessment and Plan / ED Course  I have reviewed the triage vital signs and the nursing notes.  Pertinent labs & imaging results that were available during my care of the patient were reviewed by me and considered in my medical decision making (see chart for details).  Clinical Course as of Oct 20 1719  Thu Oct 20, 2018  1349 Lab unable to located beta hcg; will reorder prior to CT scan   [MV]  1443 Repeat beta hcg sent; CT as well as nursing staff called lab; unknown issue. Will order urine preg.    [MV]  2671 Pt given flagyl for trich; she took 1,000 mg but was unable to keep the rest down without retching. I have offered to write her a weeks worth of flagyl to cover for trich; pt states she cannot afford it and would like to take the remainder of the 1,000 mg in the ED, will oblige   [MV]    Clinical Course User Index [MV] Eustaquio Maize, PA-C   43 year old female presenting to the ED with complaints of heavy vaginal bleeding and abdominal cramping x 2 days; seen in the ED for same on 04/27; had ultrasound done at that time with findings of a mass questionable for either fibroid vs or endometrial mass. Place on megace at time of discharge which improved vaginal bleeding for a couple of days. Seen by OBGY recently and placed on progesterone; has outpatient CT A/P scheduled for 07/07 to rule out mass. Pt has complaints of lightheadedness but no syncope; will obtain basic bloodwork today to rule out anemia requiring transfusion; pt was on iron supplements but taken off 3-4 days ago due to it making her feel "bad." Scheduled to have iron infusion outpatient soon. Pelvic exam also to be performed to evaluate amount of vaginal bleeding.  Considering patient has a CT scan scheduled soon and she is having worsening bleeding will obtain today in the ED for OBGYN to follow sooner. Zofran given for nausea; pt dry heaving in the room; no emesis appreciated.   H&H stable currently and improved from previous. No electrolyte abnormalities appreciated. Creatinine within normal limits today. Large amount of hgb appreciated on urine but no signs of infection. Wet prep  positive for trich; will treat in the ED today. Still awaiting CT A/P.   Hemoglobin  Date Value Ref Range Status  10/20/2018 11.0 (L) 12.0 - 15.0 g/dL Final  08/22/2018 9.6 (L) 12.0 - 15.0 g/dL Final  06/29/2017 10.3 (L) 12.0 - 15.0 g/dL Final   CT scan with multiple masses consistent with fibroids; no other pathology noted. Considering patient's hemoglobin is improved; do not feel she meets admission criteria at this time. Will discharge home with OBGYN follow up; patient given printed out CT results to take with her to her next appointment. Pt is stable for discharge at this time.        Final Clinical Impressions(s) / ED Diagnoses   Final diagnoses:  Abnormal vaginal bleeding  Fibroids  Trichimoniasis    ED Discharge Orders    None       Eustaquio Maize, PA-C 10/20/18 2034    Maudie Flakes, MD 10/26/18 [phone]

## 2018-10-21 ENCOUNTER — Other Ambulatory Visit (HOSPITAL_COMMUNITY): Payer: Self-pay | Admitting: *Deleted

## 2018-10-21 LAB — GC/CHLAMYDIA PROBE AMP (~~LOC~~) NOT AT ARMC
Chlamydia: NEGATIVE
Neisseria Gonorrhea: NEGATIVE

## 2018-10-21 LAB — HIV ANTIBODY (ROUTINE TESTING W REFLEX): HIV Screen 4th Generation wRfx: NONREACTIVE

## 2018-10-21 LAB — RPR: RPR Ser Ql: NONREACTIVE

## 2018-10-24 ENCOUNTER — Encounter (HOSPITAL_COMMUNITY): Payer: BC Managed Care – PPO

## 2018-10-27 ENCOUNTER — Encounter (HOSPITAL_COMMUNITY): Payer: BC Managed Care – PPO

## 2018-11-01 ENCOUNTER — Encounter (HOSPITAL_COMMUNITY): Payer: BC Managed Care – PPO

## 2018-11-01 ENCOUNTER — Inpatient Hospital Stay: Admission: RE | Admit: 2018-11-01 | Payer: BC Managed Care – PPO | Source: Ambulatory Visit

## 2018-11-01 ENCOUNTER — Other Ambulatory Visit (HOSPITAL_COMMUNITY): Payer: Self-pay | Admitting: *Deleted

## 2018-11-02 ENCOUNTER — Inpatient Hospital Stay (HOSPITAL_COMMUNITY)
Admission: RE | Admit: 2018-11-02 | Discharge: 2018-11-02 | Disposition: A | Discharge status: Home / Self Care | Payer: BC Managed Care – PPO | Source: Ambulatory Visit | Attending: Obstetrics and Gynecology | Admitting: Obstetrics and Gynecology

## 2018-11-04 ENCOUNTER — Other Ambulatory Visit: Payer: Self-pay

## 2018-11-04 DIAGNOSIS — Z20822 Contact with and (suspected) exposure to covid-19: Secondary | ICD-10-CM

## 2018-11-06 NOTE — Progress Notes (Signed)
[place] Screening performed. Temperature, PHQ-9,  and medication assessments. Patient reports having multiple medical appointments with no transportation access since moving to [facility]. Referred for transportation assistance.     Jobe Igo MSN, RN

## 2018-11-07 ENCOUNTER — Ambulatory Visit (HOSPITAL_COMMUNITY)
Admission: RE | Admit: 2018-11-07 | Discharge: 2018-11-07 | Disposition: A | Discharge status: Home / Self Care | Payer: BC Managed Care – PPO | Source: Ambulatory Visit | Attending: Obstetrics and Gynecology | Admitting: Obstetrics and Gynecology

## 2018-11-07 ENCOUNTER — Other Ambulatory Visit: Payer: Self-pay

## 2018-11-07 ENCOUNTER — Encounter (HOSPITAL_COMMUNITY): Payer: BC Managed Care – PPO

## 2018-11-07 DIAGNOSIS — D649 Anemia, unspecified: Secondary | ICD-10-CM | POA: Insufficient documentation

## 2018-11-07 MED ORDER — SODIUM CHLORIDE 0.9 % IV SOLN
510.0000 mg | INTRAVENOUS | Status: DC
  Administered 2018-11-07: 510 mg via INTRAVENOUS
  Filled 2018-11-07: qty 17

## 2018-11-07 NOTE — Discharge Instructions (Signed)

## 2018-11-09 NOTE — Progress Notes (Signed)
Pt has pending covid 19 test from Friday 11/04/18. I called the patient and she said she has no fever, no cough, no sore throat, and that she's been using her inhaler so she has no shortness of breath and she thinks it was her anxiety.  I  Called and spoke with Tammy from Infection prevention who stated she should be rescheduled until her test comes back negative to be safe. Called pt to reschedule her  and the patient seemed upset, stating "this covid stuff is stupid, I was just there Monday and got my iron".  I informed her that we were not aware on Monday that she had a pending test until her IV was in and the medication was already going so that's why she was given the medication that day but that I called infection prevention who stated it was better to reschedule the second one until her test results come back  just in case.  Pt stated "okay whatever."  I let her know the date and time of her next appt Monday, 11/14/2018 at 11:00 and the pt verbalized understanding.

## 2018-11-10 ENCOUNTER — Inpatient Hospital Stay (HOSPITAL_COMMUNITY)
Admission: RE | Admit: 2018-11-10 | Discharge: 2018-11-10 | Disposition: A | Discharge status: Home / Self Care | Payer: BC Managed Care – PPO | Source: Ambulatory Visit | Attending: Obstetrics and Gynecology | Admitting: Obstetrics and Gynecology

## 2018-11-10 LAB — NOVEL CORONAVIRUS, NAA: SARS-CoV-2, NAA: NOT DETECTED

## 2018-11-14 ENCOUNTER — Other Ambulatory Visit: Payer: Self-pay

## 2018-11-14 ENCOUNTER — Encounter (HOSPITAL_COMMUNITY)
Admission: RE | Admit: 2018-11-14 | Discharge: 2018-11-14 | Disposition: A | Discharge status: Home / Self Care | Payer: BC Managed Care – PPO | Source: Ambulatory Visit | Attending: Obstetrics and Gynecology | Admitting: Obstetrics and Gynecology

## 2018-11-14 DIAGNOSIS — D649 Anemia, unspecified: Secondary | ICD-10-CM | POA: Diagnosis not present

## 2018-11-14 MED ORDER — SODIUM CHLORIDE 0.9 % IV SOLN
510.0000 mg | INTRAVENOUS | Status: AC
  Administered 2018-11-14: 510 mg via INTRAVENOUS
  Filled 2018-11-14: qty 17

## 2018-11-21 ENCOUNTER — Other Ambulatory Visit: Payer: Self-pay | Admitting: Obstetrics and Gynecology

## 2018-12-13 ENCOUNTER — Emergency Department (HOSPITAL_COMMUNITY)
Admission: EM | Admit: 2018-12-13 | Discharge: 2018-12-13 | Disposition: A | Discharge status: Home / Self Care | Payer: BC Managed Care – PPO | Attending: Emergency Medicine | Admitting: Emergency Medicine

## 2018-12-13 ENCOUNTER — Emergency Department (HOSPITAL_COMMUNITY): Payer: BC Managed Care – PPO

## 2018-12-13 ENCOUNTER — Encounter (HOSPITAL_COMMUNITY): Payer: Self-pay | Admitting: Emergency Medicine

## 2018-12-13 ENCOUNTER — Emergency Department (HOSPITAL_COMMUNITY)
Admission: EM | Admit: 2018-12-13 | Discharge: 2018-12-13 | Discharge status: Against Medical Advice | Payer: BC Managed Care – PPO

## 2018-12-13 DIAGNOSIS — Y998 Other external cause status: Secondary | ICD-10-CM | POA: Diagnosis not present

## 2018-12-13 DIAGNOSIS — Z79899 Other long term (current) drug therapy: Secondary | ICD-10-CM | POA: Insufficient documentation

## 2018-12-13 DIAGNOSIS — S6992XA Unspecified injury of left wrist, hand and finger(s), initial encounter: Secondary | ICD-10-CM | POA: Diagnosis present

## 2018-12-13 DIAGNOSIS — Y9389 Activity, other specified: Secondary | ICD-10-CM | POA: Insufficient documentation

## 2018-12-13 DIAGNOSIS — Y9289 Other specified places as the place of occurrence of the external cause: Secondary | ICD-10-CM | POA: Diagnosis not present

## 2018-12-13 DIAGNOSIS — X509XXA Other and unspecified overexertion or strenuous movements or postures, initial encounter: Secondary | ICD-10-CM | POA: Insufficient documentation

## 2018-12-13 DIAGNOSIS — F17210 Nicotine dependence, cigarettes, uncomplicated: Secondary | ICD-10-CM | POA: Diagnosis not present

## 2018-12-13 DIAGNOSIS — J45909 Unspecified asthma, uncomplicated: Secondary | ICD-10-CM | POA: Insufficient documentation

## 2018-12-13 DIAGNOSIS — S63502A Unspecified sprain of left wrist, initial encounter: Secondary | ICD-10-CM | POA: Insufficient documentation

## 2018-12-13 NOTE — Discharge Instructions (Signed)
Continue ibuprofen every 6 hours for management of pain.  Ice your wrist 3-4 times per day for 15 to 20 minutes each time to limit swelling.  Use a brace for stability.  Have your symptoms reevaluated by your primary care doctor in approximately 1 week.  You may return for any new or concerning symptoms.

## 2018-12-13 NOTE — ED Triage Notes (Signed)
Patient here from hotel with complaints of left wrist and hand pain. Reports officer had cuffs on to tight. Able to move.

## 2018-12-13 NOTE — ED Provider Notes (Signed)
[facility] DEPT Provider Note   CSN: [id number] Arrival date & time: 12/13/18  0320     History   Chief Complaint Chief Complaint  Patient presents with   Wrist Pain   Hand Pain    HPI Jessica Gallagher is a 43 y.o. female.     42 year old right-hand-dominant female presents to the emergency department for complaints of left wrist pain.  She states that her pain began after she was handcuffed by police.  She states that the cuffs were on too tight causing pain and swelling to her wrist.  She did take 800 mg ibuprofen prior to arrival with some improvement to her swelling.  No reported numbness or paresthesias.  No hx of direct trauma/injury/fall.  The history is provided by the patient. No language interpreter was used.  Wrist Pain  Hand Pain    Past Medical History:  Diagnosis Date   Anemia    Asthma    Diverticulitis    Fibroid tumor    GERD (gastroesophageal reflux disease)     There are no active problems to display for this patient.   History reviewed. No pertinent surgical history.   OB History   No obstetric history on file.      Home Medications    Prior to Admission medications   Medication Sig Start Date End Date Taking? Authorizing Provider  acetaminophen (TYLENOL) 500 MG tablet Take 1,000 mg by mouth every 6 (six) hours as needed for mild pain.    [provider]  dicyclomine (BENTYL) 10 MG capsule Take 10 mg by mouth 3 (three) times daily as needed. 08/18/18 09/17/18  [provider]  methocarbamol (ROBAXIN) 500 MG tablet Take 1 tablet (500 mg total) by mouth 2 (two) times daily. 06/29/17   Volanda Napoleon, PA-C  ondansetron (ZOFRAN) 8 MG tablet Take 8 mg by mouth every 8 (eight) hours as needed for nausea or vomiting.  10/12/18   [provider]    Family History No family history on file.  Social History Social History   Tobacco Use   Smoking status: Current Every Day Smoker   Packs/day: 0.50    Types: Cigarettes   Smokeless tobacco: Never Used  Substance Use Topics   Alcohol use: Yes    Comment: occasional   Drug use: No     Allergies   Patient has no known allergies.   Review of Systems Review of Systems Ten systems reviewed and are negative for acute change, except as noted in the HPI.    Physical Exam Updated Vital Signs BP 138/75 (BP Location: Right Arm)    Pulse 69    Temp 98.9 F (37.2 C) (Oral)    Resp 16    Ht 5\' 9"  (1.753 m)    Wt 87.5 kg    LMP  (LMP Unknown)    SpO2 100%    BMI 28.50 kg/m   Physical Exam Vitals signs and nursing note reviewed.  Constitutional:      General: She is not in acute distress.    Appearance: She is well-developed. She is not diaphoretic.     Comments: Nontoxic appearing and in NAD  HENT:     Head: Normocephalic and atraumatic.  Eyes:     General: No scleral icterus.    Conjunctiva/sclera: Conjunctivae normal.  Neck:     Musculoskeletal: Normal range of motion.  Cardiovascular:     Rate and Rhythm: Normal rate and regular rhythm.  Pulses: Normal pulses.     Comments: 2+ distal radial pulse, left. Pulmonary:     Effort: Pulmonary effort is normal. No respiratory distress.  Musculoskeletal: Normal range of motion.  Skin:    General: Skin is warm and dry.     Coloration: Skin is not pale.     Findings: No erythema or rash.  Neurological:     Mental Status: She is alert and oriented to person, place, and time.  Psychiatric:        Behavior: Behavior normal.      ED Treatments / Results  Labs (all labs ordered are listed, but only abnormal results are displayed) Labs Reviewed - No data to display  EKG None  Radiology Dg Wrist Complete Left  Result Date: 12/13/2018 CLINICAL DATA:  Wrist pain after handcuffs. EXAM: LEFT WRIST - COMPLETE 3+ VIEW COMPARISON:  None. FINDINGS: Soft tissue swelling along the distal radius without opaque foreign body or gas. No acute fracture or  malalignment. Chronic appearing cystic lucency in the distal pole scaphoid, often related to incidental ganglion. IMPRESSION: Soft tissue swelling without fracture. Electronically Signed   By: Monte Fantasia M.D.   On: 12/13/2018 04:43   Dg Hand Complete Left  Result Date: 12/13/2018 CLINICAL DATA:  Wrist pain from handcuffs. EXAM: LEFT HAND - COMPLETE 3+ VIEW COMPARISON:  None. FINDINGS: Soft tissue swelling along the distal radius. No acute fracture or dislocation. Chronic appearing cystic lucency at the distal pole scaphoid, often related to ganglion. No degenerative spurring. IMPRESSION: Mild soft tissue swelling without acute osseous finding. Electronically Signed   By: Monte Fantasia M.D.   On: 12/13/2018 04:42    Procedures Procedures (including critical care time)  Medications Ordered in ED Medications - No data to display   Initial Impression / Assessment and Plan / ED Course  I have reviewed the triage vital signs and the nursing notes.  Pertinent labs & imaging results that were available during my care of the patient were reviewed by me and considered in my medical decision making (see chart for details).        Patient presents to the emergency department for evaluation of L wrist pain. Patient neurovascularly intact on exam. Imaging negative for fracture, dislocation, bony deformity. No erythema, heat to touch to the affected area; no concern for septic joint. Compartments in the affected extremity are soft. Plan for supportive management including RICE and NSAIDs; primary care follow up as needed. Return precautions discussed and provided. Patient discharged in stable condition with no unaddressed concerns.   Final Clinical Impressions(s) / ED Diagnoses   Final diagnoses:  Sprain of left wrist, initial encounter    ED Discharge Orders    None       Antonietta Breach, PA-C 12/13/18 [place], Dan, DO 12/13/18 0501

## 2018-12-20 NOTE — Progress Notes (Signed)
COVID-19 Screening performed. Temperature, PHQ-9, and need for medical care and medications assessed. Pt refused [place] referral.  Jobe Igo MSN, RN

## 2019-01-16 NOTE — Progress Notes (Signed)
COVID-19 Screening performed. Temperature, PHQ-9, and need for medical care and medications assessed. Pt referred for [facility] consult.   Jobe Igo MSN, RN

## 2019-01-31 ENCOUNTER — Other Ambulatory Visit: Payer: Self-pay | Admitting: Obstetrics and Gynecology

## 2019-03-05 ENCOUNTER — Emergency Department (HOSPITAL_COMMUNITY)
Admission: EM | Admit: 2019-03-05 | Discharge: 2019-03-05 | Disposition: A | Discharge status: Home / Self Care | Payer: BC Managed Care – PPO | Attending: Emergency Medicine | Admitting: Emergency Medicine

## 2019-03-05 ENCOUNTER — Encounter (HOSPITAL_COMMUNITY): Payer: Self-pay

## 2019-03-05 ENCOUNTER — Emergency Department (HOSPITAL_COMMUNITY): Payer: BC Managed Care – PPO

## 2019-03-05 ENCOUNTER — Other Ambulatory Visit: Payer: Self-pay

## 2019-03-05 DIAGNOSIS — R300 Dysuria: Secondary | ICD-10-CM | POA: Insufficient documentation

## 2019-03-05 DIAGNOSIS — R109 Unspecified abdominal pain: Secondary | ICD-10-CM | POA: Diagnosis not present

## 2019-03-05 DIAGNOSIS — J45909 Unspecified asthma, uncomplicated: Secondary | ICD-10-CM | POA: Diagnosis not present

## 2019-03-05 DIAGNOSIS — F17210 Nicotine dependence, cigarettes, uncomplicated: Secondary | ICD-10-CM | POA: Diagnosis not present

## 2019-03-05 DIAGNOSIS — Z79899 Other long term (current) drug therapy: Secondary | ICD-10-CM | POA: Diagnosis not present

## 2019-03-05 LAB — CBC WITH DIFFERENTIAL/PLATELET
Abs Immature Granulocytes: 0.01 10*3/uL (ref 0.00–0.07)
Basophils Absolute: 0.1 10*3/uL (ref 0.0–0.1)
Basophils Relative: 1 %
Eosinophils Absolute: 0.1 10*3/uL (ref 0.0–0.5)
Eosinophils Relative: 3 %
HCT: 37.1 % (ref 36.0–46.0)
Hemoglobin: 12.1 g/dL (ref 12.0–15.0)
Immature Granulocytes: 0 %
Lymphocytes Relative: 43 %
Lymphs Abs: 2.1 10*3/uL (ref 0.7–4.0)
MCH: 33.2 pg (ref 26.0–34.0)
MCHC: 32.6 g/dL (ref 30.0–36.0)
MCV: 101.9 fL — ABNORMAL HIGH (ref 80.0–100.0)
Monocytes Absolute: 0.6 10*3/uL (ref 0.1–1.0)
Monocytes Relative: 11 %
Neutro Abs: 2.1 10*3/uL (ref 1.7–7.7)
Neutrophils Relative %: 42 %
Platelets: 337 10*3/uL (ref 150–400)
RBC: 3.64 MIL/uL — ABNORMAL LOW (ref 3.87–5.11)
RDW: 13.7 % (ref 11.5–15.5)
WBC: 4.9 10*3/uL (ref 4.0–10.5)
nRBC: 0 % (ref 0.0–0.2)

## 2019-03-05 LAB — LIPASE, BLOOD: Lipase: 30 U/L (ref 11–51)

## 2019-03-05 LAB — URINALYSIS, ROUTINE W REFLEX MICROSCOPIC
Bilirubin Urine: NEGATIVE
Glucose, UA: NEGATIVE mg/dL
Hgb urine dipstick: NEGATIVE
Ketones, ur: NEGATIVE mg/dL
Leukocytes,Ua: NEGATIVE
Nitrite: NEGATIVE
Protein, ur: NEGATIVE mg/dL
Specific Gravity, Urine: 1.017 (ref 1.005–1.030)
pH: 6 (ref 5.0–8.0)

## 2019-03-05 LAB — COMPREHENSIVE METABOLIC PANEL
ALT: 13 U/L (ref 0–44)
AST: 17 U/L (ref 15–41)
Albumin: 3.8 g/dL (ref 3.5–5.0)
Alkaline Phosphatase: 44 U/L (ref 38–126)
Anion gap: 9 (ref 5–15)
BUN: <5 mg/dL — ABNORMAL LOW (ref 6–20)
CO2: 21 mmol/L — ABNORMAL LOW (ref 22–32)
Calcium: 8.9 mg/dL (ref 8.9–10.3)
Chloride: 107 mmol/L (ref 98–111)
Creatinine, Ser: 0.67 mg/dL (ref 0.44–1.00)
GFR calc Af Amer: >60 mL/min (ref >60)
GFR calc non Af Amer: >60 mL/min (ref >60)
Glucose, Bld: 121 mg/dL — ABNORMAL HIGH (ref 70–99)
Potassium: 3.6 mmol/L (ref 3.5–5.1)
Sodium: 137 mmol/L (ref 135–145)
Total Bilirubin: 0.7 mg/dL (ref 0.3–1.2)
Total Protein: 6.5 g/dL (ref 6.5–8.1)

## 2019-03-05 LAB — I-STAT BETA HCG BLOOD, ED (MC, WL, AP ONLY): I-stat hCG, quantitative: <5 m[IU]/mL (ref <5)

## 2019-03-05 MED ORDER — CEPHALEXIN 500 MG PO CAPS: 500.0000 mg | ORAL_CAPSULE | Freq: Four times a day (QID) | ORAL | 0 refills | Status: AC

## 2019-03-05 MED ORDER — HYDROCODONE-ACETAMINOPHEN 5-325 MG PO TABS
1.0000 | ORAL_TABLET | Freq: Once | ORAL | Status: AC
  Administered 2019-03-05: 1 via ORAL
  Filled 2019-03-05: qty 1

## 2019-03-05 MED ORDER — ACETAMINOPHEN 325 MG PO TABS: 650.0000 mg | ORAL_TABLET | Freq: Four times a day (QID) | ORAL | 0 refills | Status: DC | PRN

## 2019-03-05 NOTE — Discharge Instructions (Addendum)
Take the antibiotics as prescribed. Follow up with PCP for reevaluation if you pain does not resolve. May take Tylenol as needed for pain.  Do not take more than 4000 mg daily.  Return for any new worsening symptoms

## 2019-03-05 NOTE — ED Triage Notes (Signed)
PT reports a new Dx of Lupus after the  Surgery in oct

## 2019-03-05 NOTE — ED Triage Notes (Signed)
Pt  Took 2 caps of nitrofurantoin mono her boy friend had at home. Pt also reports a hysterectomy on 01-31-19. Pt reports she has one overy in place.

## 2019-03-05 NOTE — ED Triage Notes (Signed)
Patient complains of left sided flank pain and urinary urgency since awakening. Denies fever, no other associated symptoms

## 2019-03-05 NOTE — ED Notes (Signed)
Patient verbalizes understanding of discharge instructions . Opportunity for questions and answers were provided . Armband removed by staff ,Pt discharged from ED. W/C  offered at D/C  and Declined W/C at D/C and was escorted to lobby by RN.  

## 2019-03-05 NOTE — ED Provider Notes (Signed)
[facility] EMERGENCY DEPARTMENT Provider Note   CSN: [id number] Arrival date & time: 03/05/19  0857    History   Chief Complaint Chief Complaint  Patient presents with  . flank pain/ UTI    HPI Jessica Gallagher is a 43 y.o. female with has medical history significant for lupus, anemia, asthma who presents for evaluation of urinary symptoms.  Patient states 4 days ago she noticed burning with urination and urgency.  Patient states she took her boyfriend's Macrobid over the last 3 days.  Patient states that the majority of her urinary symptoms have significantly improved however she now has bilateral flank pain.  Her pain is worse with movement.  Patient denies fever, chills, nausea, vomiting, chest pain, shortness of breath, hematuria, diarrhea, constipation.  She does note some mild suprapubic discomfort however states she is able to urinate without difficulty.  Patient states she has had the suprapubic comfort for "a long time."  She does state that she recently underwent a laparoscopic hysterectomy approximately 1 month ago.  She is followed up with her OB/GYN for this and has not any complications.  She denies any redness, swelling, warmth, drainage or bleeding from her surgical scars.  Denies any pelvic pain or vaginal discharge.  She denies any concerns for STDs and has not been sexually active since prior to her surgery.  Rates her current pain a 4/10.  Pain located to bilateral flanks.  Denies additional aggravating or alleviating factors. No prior hx of nephrolithiasis or pyelonephritis.   History obtained from patient and past medical records.  No interpreter is used.     HPI  Past Medical History:  Diagnosis Date  . Anemia   . Asthma   . Diverticulitis   . Fibroid tumor   . GERD (gastroesophageal reflux disease)     There are no active problems to display for this patient.   History reviewed. No pertinent surgical history.   OB History   No obstetric  history on file.      Home Medications    Prior to Admission medications   Medication Sig Start Date End Date Taking? Authorizing Provider  acetaminophen (TYLENOL) 500 MG tablet Take 1,000 mg by mouth every 6 (six) hours as needed for mild pain.   Yes [provider]  albuterol (VENTOLIN HFA) 108 (90 Base) MCG/ACT inhaler Inhale 1-2 puffs into the lungs every 4 (four) hours as needed for shortness of breath. 11/14/18  Yes [provider]  diclofenac (VOLTAREN) 75 MG EC tablet Take 75 mg by mouth 2 (two) times daily. 01/25/19  Yes [provider]  FLUoxetine (PROZAC) 20 MG capsule Take 20 mg by mouth daily. 01/25/19  Yes [provider]  fluticasone-salmeterol (ADVAIR HFA) 45-21 MCG/ACT inhaler Inhale 2 puffs into the lungs 2 (two) times daily. 11/14/18  Yes [provider]  ibuprofen (ADVIL) 800 MG tablet Take 800 mg by mouth 3 (three) times daily as needed for pain. 02/03/19  Yes [provider]  methocarbamol (ROBAXIN) 500 MG tablet Take 1 tablet (500 mg total) by mouth 2 (two) times daily. 06/29/17  Yes Volanda Napoleon, PA-C  nitrofurantoin (MACRODANTIN) 100 MG capsule Take 100 mg by mouth at bedtime.   Yes [provider]  ondansetron (ZOFRAN) 8 MG tablet Take 8 mg by mouth every 8 (eight) hours as needed for nausea or vomiting.  10/12/18  Yes [provider]  tiZANidine (ZANAFLEX) 4 MG tablet Take 4 mg by mouth every 6 (six)  hours as needed for spasms. 02/28/19  Yes [provider]  vitamin B-12 (CYANOCOBALAMIN) 1000 MCG tablet Take 1,000 mcg by mouth daily. 03/01/19  Yes [provider]  cephALEXin (KEFLEX) 500 MG capsule Take 1 capsule (500 mg total) by mouth 4 (four) times daily for 7 days. 03/05/19 03/12/19  Valta Dillon A, PA-C  dicyclomine (BENTYL) 10 MG capsule Take 10 mg by mouth 3 (three) times daily as needed. 08/18/18 09/17/18  [provider]    Family History No family history on  file.  Social History Social History   Tobacco Use  . Smoking status: Current Every Day Smoker    Packs/day: 0.50    Types: Cigarettes  . Smokeless tobacco: Never Used  Substance Use Topics  . Alcohol use: Yes    Comment: occasional  . Drug use: No     Allergies   Patient has no known allergies.   Review of Systems Review of Systems  Constitutional: Negative.   HENT: Negative.   Respiratory: Negative.   Cardiovascular: Negative.   Gastrointestinal: Negative.   Genitourinary: Positive for dysuria, flank pain and frequency. Negative for decreased urine volume, difficulty urinating, genital sores, hematuria, menstrual problem, pelvic pain, urgency, vaginal bleeding, vaginal discharge and vaginal pain.  Musculoskeletal: Negative for back pain and gait problem.  Skin: Negative.   Neurological: Negative.   All other systems reviewed and are negative.    Physical Exam Updated Vital Signs BP 122/78 (BP Location: Right Arm)   Pulse 75   Temp 98.7 F (37.1 C) (Oral)   Resp 12   LMP  (LMP Unknown)   SpO2 100%   Physical Exam Vitals signs and nursing note reviewed.  Constitutional:      General: She is not in acute distress.    Appearance: She is well-developed. She is not ill-appearing, toxic-appearing or diaphoretic.  HENT:     Head: Normocephalic and atraumatic.     Nose: Nose normal.     Mouth/Throat:     Mouth: Mucous membranes are moist.     Pharynx: Oropharynx is clear.  Eyes:     Pupils: Pupils are equal, round, and reactive to light.  Neck:     Musculoskeletal: Normal range of motion.  Cardiovascular:     Rate and Rhythm: Normal rate.     Pulses: Normal pulses.     Heart sounds: Normal heart sounds.  Pulmonary:     Effort: Pulmonary effort is normal. No respiratory distress.     Breath sounds: Normal breath sounds.  Abdominal:     General: There is no distension.     Comments: Soft without rebound or guarding.  She has minimal tenderness to her  suprapubic region.  She does have some tenderness over her bilateral flanks however negative CVA tap.  No rebound or guarding.  Normoactive bowel sounds.  All laparoscopic incisional scars without redness, warmth, drainage or bleeding.  Musculoskeletal: Normal range of motion.     Comments: Moves all 4 extremities without difficulty.  Skin:    General: Skin is warm and dry.     Comments: Brisk capillary refill without rashes, lesions, fluctuance or induration.  Neurological:     Mental Status: She is alert.     Comments: Ambulatory without difficulty.      ED Treatments / Results  Labs (all labs ordered are listed, but only abnormal results are displayed) Labs Reviewed  CBC WITH DIFFERENTIAL/PLATELET - Abnormal; Notable for the following components:      Result  Value   RBC 3.64 (*)    MCV 101.9 (*)    All other components within normal limits  COMPREHENSIVE METABOLIC PANEL - Abnormal; Notable for the following components:   CO2 21 (*)    Glucose, Bld 121 (*)    BUN <5 (*)    All other components within normal limits  URINE CULTURE  URINALYSIS, ROUTINE W REFLEX MICROSCOPIC  LIPASE, BLOOD  I-STAT BETA HCG BLOOD, ED ([facility], [facility], [facility] ONLY)    EKG None  Radiology Ct Renal Stone Study  Result Date: 03/05/2019 CLINICAL DATA:  Left flank pain, increased urinary frequency. Status post hysterectomy on 01/31/2019 EXAM: CT ABDOMEN AND PELVIS WITHOUT CONTRAST TECHNIQUE: Multidetector CT imaging of the abdomen and pelvis was performed following the standard protocol without IV contrast. COMPARISON:  10/20/2018 FINDINGS: Lower chest: Lung bases are clear. Hepatobiliary: Unenhanced liver is unremarkable. Status post cholecystectomy. No intrahepatic or extrahepatic ductal dilatation. Pancreas: Within normal limits. Spleen: Within normal limits. Adrenals/Urinary Tract: Adrenal glands are within normal limits. Kidneys are within normal limits. No renal, ureteral, or bladder calculi. No hydronephrosis.  Bladder is within normal limits. Stomach/Bowel: Stomach is within normal limits. No evidence of bowel obstruction. Normal appendix (series 3/image 70). Extensive colonic diverticulosis, without evidence of diverticulitis. Vascular/Lymphatic: No evidence of abdominal aortic aneurysm. No suspicious abdominopelvic lymphadenopathy. Reproductive: Status post hysterectomy. 4.7 x 5.1 cm right adnexal cystic lesion (series 3/image 82), new. Suspected left oophorectomy.  No left adnexal mass. Other: Small volume pelvic ascites, likely physiologic Musculoskeletal: Mild degenerative changes of the lower lumbar spine. IMPRESSION: No renal, ureteral, or bladder calculi.  No hydronephrosis. Extensive colonic diverticulosis, without evidence of diverticulitis. Status post hysterectomy and suspected left oophorectomy. 5.1 cm right adnexal cystic lesion, likely physiologic. Consider follow-up pelvic ultrasound in 6-12 weeks, as clinically warranted. Status post cholecystectomy. Electronically Signed   By: Julian Hy M.D.   On: 03/05/2019 11:45    Procedures Procedures (including critical care time)  Medications Ordered in ED Medications  HYDROcodone-acetaminophen (NORCO/VICODIN) 5-325 MG per tablet 1 tablet (1 tablet Oral Given 03/05/19 1008)     Initial Impression / Assessment and Plan / ED Course  I have reviewed the triage vital signs and the nursing notes.  Pertinent labs & imaging results that were available during my care of the patient were reviewed by me and considered in my medical decision making (see chart for details).  43 year old female appears otherwise well presents for evaluation of urinary symptoms of bilateral flank pain.  She is afebrile, nonseptic, non-ill-appearing.  She has taken 3 days of Macrobid out of an old prescription.  States her urinary symptoms have significantly resolved however she now has flank pain.  She does have some mild tenderness to her bilateral flanks however  negative CVA tap.  No overlying skin changes.  No midline spinal tenderness.  She does have some mild tenderness to her suprapubic region however no rebound, guarding.  No overlying skin changes.  She did have a upper scopic hysterectomy 1 month ago however no bleeding, drainage, redness or surrounding warmth to her surgical sites.  She is followed up with OB/GYN is not any complications.  She is able to empty her bladder without difficulty.  Labs and imaging personally reviewed  CBC without leukocytosis Pregnancy test negative Urinalysis negative for infection, will culture Metabolic panel with mild elevation in glucose at 121 Lipase 30  Patient reevaluated she has no pain.  Given she does have urinary symptoms of left flank pain will  culture urine.  Will start on Keflex for possible pyelonephritis.  CT stone study does not show any acute AP pathology.  Discussed plan with patient.  Shared decision making for antibiotics given negative urinalysis today.  Patient would rather start on antibiotics and de-escalate once culture is negative.  Patient is nontoxic, nonseptic appearing, in no apparent distress.  Patient's pain and other symptoms adequately managed in emergency department.  Fluid bolus given.  Labs, imaging and vitals reviewed.  Patient does not meet the SIRS or Sepsis criteria.  On repeat exam patient does not have a surgical abdomin and there are no peritoneal signs.  No indication of appendicitis, bowel obstruction, bowel perforation, cholecystitis, diverticulitis, PID or ectopic pregnancy.  Patient discharged home with symptomatic treatment and given strict instructions for follow-up with their primary care physician.  I have also discussed reasons to return immediately to the ER.  Patient expresses understanding and agrees with plan.      Final Clinical Impressions(s) / ED Diagnoses   Final diagnoses:  Flank pain  Dysuria    ED Discharge Orders         Ordered    cephALEXin  (KEFLEX) 500 MG capsule  4 times daily     03/05/19 1158           Nicoletta Hush A, PA-C 03/05/19 1212    Blanchie Dessert, MD 03/05/19 1444

## 2019-03-06 LAB — URINE CULTURE: Culture: NO GROWTH

## 2019-03-10 ENCOUNTER — Emergency Department (HOSPITAL_COMMUNITY): Payer: BC Managed Care – PPO

## 2019-03-10 ENCOUNTER — Emergency Department (HOSPITAL_COMMUNITY)
Admission: EM | Admit: 2019-03-10 | Discharge: 2019-03-10 | Disposition: A | Discharge status: Home / Self Care | Payer: BC Managed Care – PPO | Attending: Emergency Medicine | Admitting: Emergency Medicine

## 2019-03-10 ENCOUNTER — Encounter (HOSPITAL_COMMUNITY): Payer: Self-pay | Admitting: Emergency Medicine

## 2019-03-10 ENCOUNTER — Other Ambulatory Visit: Payer: Self-pay

## 2019-03-10 DIAGNOSIS — R0602 Shortness of breath: Secondary | ICD-10-CM | POA: Diagnosis present

## 2019-03-10 DIAGNOSIS — F17210 Nicotine dependence, cigarettes, uncomplicated: Secondary | ICD-10-CM | POA: Diagnosis not present

## 2019-03-10 DIAGNOSIS — J4521 Mild intermittent asthma with (acute) exacerbation: Secondary | ICD-10-CM | POA: Diagnosis not present

## 2019-03-10 DIAGNOSIS — Z79899 Other long term (current) drug therapy: Secondary | ICD-10-CM | POA: Diagnosis not present

## 2019-03-10 DIAGNOSIS — J45909 Unspecified asthma, uncomplicated: Secondary | ICD-10-CM | POA: Insufficient documentation

## 2019-03-10 LAB — I-STAT BETA HCG BLOOD, ED (MC, WL, AP ONLY): I-stat hCG, quantitative: <5 m[IU]/mL (ref <5)

## 2019-03-10 LAB — TROPONIN I (HIGH SENSITIVITY)
Troponin I (High Sensitivity): 3 ng/L (ref <18)
Troponin I (High Sensitivity): 3 ng/L (ref <18)

## 2019-03-10 LAB — CBC
HCT: 39.7 % (ref 36.0–46.0)
Hemoglobin: 13.3 g/dL (ref 12.0–15.0)
MCH: 33.8 pg (ref 26.0–34.0)
MCHC: 33.5 g/dL (ref 30.0–36.0)
MCV: 101 fL — ABNORMAL HIGH (ref 80.0–100.0)
Platelets: 380 10*3/uL (ref 150–400)
RBC: 3.93 MIL/uL (ref 3.87–5.11)
RDW: 13.3 % (ref 11.5–15.5)
WBC: 5 10*3/uL (ref 4.0–10.5)
nRBC: 0 % (ref 0.0–0.2)

## 2019-03-10 LAB — URINALYSIS, ROUTINE W REFLEX MICROSCOPIC
Bilirubin Urine: NEGATIVE
Glucose, UA: NEGATIVE mg/dL
Hgb urine dipstick: NEGATIVE
Ketones, ur: NEGATIVE mg/dL
Leukocytes,Ua: NEGATIVE
Nitrite: NEGATIVE
Protein, ur: 30 mg/dL — AB
Specific Gravity, Urine: 1.031 — ABNORMAL HIGH (ref 1.005–1.030)
pH: 5 (ref 5.0–8.0)

## 2019-03-10 LAB — BASIC METABOLIC PANEL
Anion gap: 14 (ref 5–15)
BUN: 9 mg/dL (ref 6–20)
CO2: 21 mmol/L — ABNORMAL LOW (ref 22–32)
Calcium: 9.4 mg/dL (ref 8.9–10.3)
Chloride: 103 mmol/L (ref 98–111)
Creatinine, Ser: 0.67 mg/dL (ref 0.44–1.00)
GFR calc Af Amer: >60 mL/min (ref >60)
GFR calc non Af Amer: >60 mL/min (ref >60)
Glucose, Bld: 113 mg/dL — ABNORMAL HIGH (ref 70–99)
Potassium: 3.4 mmol/L — ABNORMAL LOW (ref 3.5–5.1)
Sodium: 138 mmol/L (ref 135–145)

## 2019-03-10 MED ORDER — PREDNISONE 10 MG PO TABS: 40.0000 mg | ORAL_TABLET | Freq: Every day | ORAL | 0 refills | Status: AC

## 2019-03-10 MED ORDER — ALBUTEROL SULFATE HFA 108 (90 BASE) MCG/ACT IN AERS
2.0000 | INHALATION_SPRAY | Freq: Once | RESPIRATORY_TRACT | Status: DC
  Filled 2019-03-10: qty 6.7

## 2019-03-10 MED ORDER — ALBUTEROL SULFATE HFA 108 (90 BASE) MCG/ACT IN AERS: 8.0000 | INHALATION_SPRAY | Freq: Once | RESPIRATORY_TRACT | Status: DC

## 2019-03-10 MED ORDER — AEROCHAMBER PLUS FLO-VU LARGE MISC
1.0000 | Freq: Once | Status: DC
  Filled 2019-03-10 (×2): qty 1

## 2019-03-10 MED ORDER — IOHEXOL 350 MG/ML SOLN
100.0000 mL | Freq: Once | INTRAVENOUS | Status: AC | PRN
  Administered 2019-03-10: 14:00:00 60 mL via INTRAVENOUS

## 2019-03-10 NOTE — ED Triage Notes (Signed)
Pt reports having SOB and also feels like she has a kidney infection. States she had hysterectomy in Oct and worried about blood clots. Hx of asthma

## 2019-03-10 NOTE — Discharge Instructions (Signed)
As discussed, you are leaving against medical advice and your workup was not complete. I am prescribing you prednisone for your asthma exacerbation. Please take as prescribed. Follow-up with your PCP on Monday if your symptoms do not improve. Return to the ER if you have severe shortness of breath, central chest pain that radiates to your left arm or jaw, or worsening of symptoms.

## 2019-03-10 NOTE — ED Provider Notes (Signed)
Patient placed in Quick Look pathway, seen and evaluated   Chief Complaint: SOB/CP  HPI:   Hysterectomy 01/31/2019, felt well initially however for past 2 weeks having increased SOB and dyspnea on exertion, constant, no unable to ambulate around house. Chest pain central and pleuritic. Sent in by PCP for concern of pulmonary embolism. History of asthma, reports this feels different, reports has been using albuterol without relief.   ROS: +SOB, CP            -  Fever, Chills, Hemoptysis, Abd pain, n/v/d, swelling, color change  Physical Exam:   Gen: No distress  Neuro: Awake and Alert  Skin: Warm    Focused Exam: Tachypenic, no accessory use, HRRR w/o murmur, LCTAB, Abd soft nontender, NVI x4 without swelling or sign of DVT. VSS.   CBC, BMP, Troponin, CXR, EKG, cardiac monitor, CT angio PE study. Albuterol inhaler. Will need room in main ED.  Initiation of care has begun. The patient has been counseled on the process, plan, and necessity for staying for the completion/evaluation, and the remainder of the medical screening examination   Gari Crown 03/10/19 1039    Valarie Merino, MD 03/10/19 1555

## 2019-03-10 NOTE — ED Provider Notes (Signed)
[facility] EMERGENCY DEPARTMENT Provider Note   CSN: [id number] Arrival date & time: 03/10/19  [id number]     History   Chief Complaint Chief Complaint  Patient presents with  . Shortness of Breath    HPI Jessica Gallagher is a 43 y.o. female with a past medical history significant for anemia, asthma, diverticulitis, fibroids, GERD, and was just diagnosed with lupus last week who presents to the ED due to worsening shortness of breath for the past 2 weeks. Patient notes she has chronic shortness of breath however it has gotten progressively worse. She notes that even turning over in bed causes severe shortness of breath.  Patient has tried her nebulizer and abluterol at home with mild relief. Shortness of breath is associated with central, pleuritic chest pain. Patient was sent by her PCP for concern of PE given her hysterectomy on 01/31/2019. Denies recent travel. No history of cancer. Patient denies lower extremity edema. Patient denies fever, chills, hemoptysis, abdominal pain, nausea, vomiting, and diarrhea.    Past Medical History:  Diagnosis Date  . Anemia   . Asthma   . Diverticulitis   . Fibroid tumor   . GERD (gastroesophageal reflux disease)     There are no active problems to display for this patient.   History reviewed. No pertinent surgical history.   OB History   No obstetric history on file.      Home Medications    Prior to Admission medications   Medication Sig Start Date End Date Taking? Authorizing Provider  acetaminophen (TYLENOL) 325 MG tablet Take 2 tablets (650 mg total) by mouth every 6 (six) hours as needed. 03/05/19   Henderly, Britni A, PA-C  albuterol (VENTOLIN HFA) 108 (90 Base) MCG/ACT inhaler Inhale 1-2 puffs into the lungs every 4 (four) hours as needed for shortness of breath. 11/14/18   [provider]  cephALEXin (KEFLEX) 500 MG capsule Take 1 capsule (500 mg total) by mouth 4 (four) times daily for 7 days. 03/05/19  03/12/19  Henderly, Britni A, PA-C  diclofenac (VOLTAREN) 75 MG EC tablet Take 75 mg by mouth 2 (two) times daily. 01/25/19   [provider]  dicyclomine (BENTYL) 10 MG capsule Take 10 mg by mouth 3 (three) times daily as needed. 08/18/18 09/17/18  [provider]  FLUoxetine (PROZAC) 20 MG capsule Take 20 mg by mouth daily. 01/25/19   [provider]  fluticasone-salmeterol (ADVAIR HFA) 45-21 MCG/ACT inhaler Inhale 2 puffs into the lungs 2 (two) times daily. 11/14/18   [provider]  ibuprofen (ADVIL) 800 MG tablet Take 800 mg by mouth 3 (three) times daily as needed for pain. 02/03/19   [provider]  methocarbamol (ROBAXIN) 500 MG tablet Take 1 tablet (500 mg total) by mouth 2 (two) times daily. 06/29/17   Volanda Napoleon, PA-C  nitrofurantoin (MACRODANTIN) 100 MG capsule Take 100 mg by mouth at bedtime.    [provider]  ondansetron (ZOFRAN) 8 MG tablet Take 8 mg by mouth every 8 (eight) hours as needed for nausea or vomiting.  10/12/18   [provider]  predniSONE (DELTASONE) 10 MG tablet Take 4 tablets (40 mg total) by mouth daily for 5 days. 03/10/19 03/15/19  Cheek, Comer Locket, PA-C  tiZANidine (ZANAFLEX) 4 MG tablet Take 4 mg by mouth every 6 (six) hours as needed for spasms. 02/28/19   [provider]  vitamin B-12 (CYANOCOBALAMIN) 1000 MCG tablet Take 1,000 mcg by mouth daily. 03/01/19  [provider]    Family History No family history on file.  Social History Social History   Tobacco Use  . Smoking status: Current Every Day Smoker    Packs/day: 0.50    Types: Cigarettes  . Smokeless tobacco: Never Used  Substance Use Topics  . Alcohol use: Yes    Comment: occasional  . Drug use: No     Allergies   Patient has no known allergies.   Review of Systems Review of Systems  Constitutional: Negative for chills and fever.  HENT: Negative for sore throat.   Respiratory: Positive for shortness  of breath and wheezing. Negative for cough.   Cardiovascular: Positive for chest pain and palpitations. Negative for leg swelling.  Gastrointestinal: Negative for abdominal pain, diarrhea, nausea and vomiting.     Physical Exam Updated Vital Signs BP (!) 154/93 (BP Location: Left Arm)   Pulse 99   Temp 97.6 F (36.4 C) (Oral)   Resp 18   Ht 5\' 9"  (1.753 m)   Wt 94.8 kg   LMP  (LMP Unknown)   SpO2 100%   BMI 30.86 kg/m   Physical Exam Vitals signs and nursing note reviewed.  Constitutional:      General: She is not in acute distress.    Appearance: She is not toxic-appearing.  HENT:     Head: Normocephalic.  Eyes:     General: Scleral icterus (bilateral) present.  Neck:     Musculoskeletal: Normal range of motion and neck supple. No neck rigidity.  Cardiovascular:     Rate and Rhythm: Normal rate and regular rhythm.     Pulses: Normal pulses.     Heart sounds: Normal heart sounds. No murmur. No friction rub. No gallop.   Pulmonary:     Effort: Pulmonary effort is normal.     Breath sounds: Normal breath sounds.     Comments: Late expiratory wheeze heard throughout.  Patient speaking in short sentences. No accessory muscle use. Abdominal:     General: Abdomen is flat. Bowel sounds are normal. There is no distension.     Palpations: Abdomen is soft.  Musculoskeletal:     Right lower leg: No edema.     Left lower leg: No edema.     Comments: Able to move all 4 extremities.  No lower extremity edema.  Negative Homans' sign bilaterally  Skin:    General: Skin is warm and dry.     Capillary Refill: Capillary refill takes less than 2 seconds.  Neurological:     General: No focal deficit present.     Mental Status: She is alert.      ED Treatments / Results  Labs (all labs ordered are listed, but only abnormal results are displayed) Labs Reviewed  BASIC METABOLIC PANEL - Abnormal; Notable for the following components:      Result Value   Potassium 3.4 (*)    CO2  21 (*)    Glucose, Bld 113 (*)    All other components within normal limits  CBC - Abnormal; Notable for the following components:   MCV 101.0 (*)    All other components within normal limits  URINALYSIS, ROUTINE W REFLEX MICROSCOPIC - Abnormal; Notable for the following components:   Specific Gravity, Urine 1.031 (*)    Protein, ur 30 (*)    Bacteria, UA RARE (*)    All other components within normal limits  I-STAT BETA HCG BLOOD, ED ([facility], [facility], [facility] ONLY)  TROPONIN I (HIGH  SENSITIVITY)  TROPONIN I (HIGH SENSITIVITY)    EKG None  Radiology Dg Chest 2 View  Result Date: 03/10/2019 CLINICAL DATA:  Shortness of breath EXAM: CHEST - 2 VIEW COMPARISON:  November 15, 2018 FINDINGS: The heart size and mediastinal contours are within normal limits. Both lungs are clear. The visualized skeletal structures are unremarkable. IMPRESSION: No active cardiopulmonary disease. Electronically Signed   By: Abelardo Diesel M.D.   On: 03/10/2019 10:39   Ct Angio Chest Pe W And/or Wo Contrast  Result Date: 03/10/2019 CLINICAL DATA:  Shortness of breath for 3 days. Assess for pulmonary embolus. EXAM: CT ANGIOGRAPHY CHEST WITH CONTRAST TECHNIQUE: Multidetector CT imaging of the chest was performed using the standard protocol during bolus administration of intravenous contrast. Multiplanar CT image reconstructions and MIPs were obtained to evaluate the vascular anatomy. CONTRAST:  12mL OMNIPAQUE IOHEXOL 350 MG/ML SOLN COMPARISON:  Chest x-ray March 10, 2019 FINDINGS: Cardiovascular: Satisfactory opacification of the pulmonary arteries to the segmental level. No evidence of pulmonary embolism. Normal heart size. No pericardial effusion. Mediastinum/Nodes: No enlarged mediastinal, hilar, or axillary lymph nodes. Thyroid gland, trachea, and esophagus demonstrate no significant findings. Lungs/Pleura: Mild atelectasis of the posterior lung bases are noted. There is no focal pneumonia. No pleural effusion or pneumothorax.  Upper Abdomen: No acute abnormality. Musculoskeletal: No acute abnormality. Review of the MIP images confirms the above findings. IMPRESSION: No evidence of pulmonary embolus. Mild atelectasis of posterior lung bases.  No focal pneumonia. Electronically Signed   By: Abelardo Diesel M.D.   On: 03/10/2019 14:19    Procedures Procedures (including critical care time)  Medications Ordered in ED Medications  iohexol (OMNIPAQUE) 350 MG/ML injection 100 mL (60 mLs Intravenous Contrast Given 03/10/19 1412)     Initial Impression / Assessment and Plan / ED Course  I have reviewed the triage vital signs and the nursing notes.  Pertinent labs & imaging results that were available during my care of the patient were reviewed by me and considered in my medical decision making (see chart for details).       43 year old female presents to the ED for an evaluation of acute on chronic shortness of breath. Patient's PCP sent her due to concern of PE. Patient recently had a hysterectomy on 01/31/2019. Patient afebrile, not tachycardic or hypoxic. Patient in no acute distress and non-toxic appearing. Late expiratory wheeze heard throughout. No lower extremity edema. EKG, CTA, CXR, and labs ordered during medical screen  Serial troponins negative. BMP and CBC reassuring with no leukocytosis, normal renal function, and no electrolyte derangements. UA negative for signs of infection. EKG reviewed which demonstrated sinus rhythm. CXR personally reviewed with no signs of pneumonia or PTX. CTA negative for PE. Suspect patient's symptoms are related to asthma exacerbation.  Discussed results with patient and offered albuterol treatment; however, patient states she would like to leave AMA since she does not have a PE.I have discussed my concerns as her provider and the possibility that this may worsen. I have specifically discussed that without further evaluation and treatment I cannot guarantee there is not a life  threatening event occuring. Pt is A&Ox4, her own POA and states understanding of my concerns and the possible consequences. I have made pt aware that this is an [facility] discharge, but he may return at any time for further evaluation and treatment. Patient provided prescription for prednisone for asthma exacerbation.   Final Clinical Impressions(s) / ED Diagnoses   Final diagnoses:  Mild intermittent asthma with exacerbation  ED Discharge Orders         Ordered    predniSONE (DELTASONE) 10 MG tablet  Daily     03/10/19 1609           Romie Levee 03/11/19 0110    Lajean Saver, MD 03/11/19 1443

## 2019-03-10 NOTE — ED Notes (Signed)
Patient transported to X-ray 

## 2019-05-15 ENCOUNTER — Emergency Department (HOSPITAL_COMMUNITY)
Admission: EM | Admit: 2019-05-15 | Discharge: 2019-05-15 | Disposition: A | Discharge status: Home / Self Care | Payer: 59 | Attending: Emergency Medicine | Admitting: Emergency Medicine

## 2019-05-15 ENCOUNTER — Other Ambulatory Visit: Payer: Self-pay

## 2019-05-15 ENCOUNTER — Emergency Department (HOSPITAL_COMMUNITY): Payer: 59

## 2019-05-15 DIAGNOSIS — Z79899 Other long term (current) drug therapy: Secondary | ICD-10-CM | POA: Insufficient documentation

## 2019-05-15 DIAGNOSIS — J45909 Unspecified asthma, uncomplicated: Secondary | ICD-10-CM | POA: Insufficient documentation

## 2019-05-15 DIAGNOSIS — M25551 Pain in right hip: Secondary | ICD-10-CM | POA: Insufficient documentation

## 2019-05-15 DIAGNOSIS — M79604 Pain in right leg: Secondary | ICD-10-CM | POA: Diagnosis not present

## 2019-05-15 DIAGNOSIS — R52 Pain, unspecified: Secondary | ICD-10-CM

## 2019-05-15 DIAGNOSIS — F17210 Nicotine dependence, cigarettes, uncomplicated: Secondary | ICD-10-CM | POA: Insufficient documentation

## 2019-05-15 DIAGNOSIS — G8929 Other chronic pain: Secondary | ICD-10-CM | POA: Diagnosis not present

## 2019-05-15 MED ORDER — HYDROCODONE-ACETAMINOPHEN 5-325 MG PO TABS
1.0000 | ORAL_TABLET | Freq: Once | ORAL | Status: AC
  Administered 2019-05-15: 1 via ORAL
  Filled 2019-05-15: qty 1

## 2019-05-15 MED ORDER — KETOROLAC TROMETHAMINE 30 MG/ML IJ SOLN
30.0000 mg | Freq: Once | INTRAMUSCULAR | Status: AC
  Administered 2019-05-15: 30 mg via INTRAMUSCULAR
  Filled 2019-05-15: qty 1

## 2019-05-15 MED ORDER — LIDOCAINE 5 % EX PTCH
2.0000 | MEDICATED_PATCH | CUTANEOUS | Status: DC
  Administered 2019-05-15: 2 via TRANSDERMAL
  Filled 2019-05-15: qty 2

## 2019-05-15 NOTE — ED Triage Notes (Signed)
Pt here for chronic R leg pain. Sts she has always had trouble with her leg but "it has turned into crucial chronic pain or something." Home rx not working. Sts it is keeping her from walking. Pt ambulatory.

## 2019-05-15 NOTE — ED Provider Notes (Signed)
[facility] EMERGENCY DEPARTMENT Provider Note   CSN: [id number] Arrival date & time: 05/15/19  1445     History Chief Complaint  Patient presents with  . Leg Pain    Jessica Gallagher is a 44 y.o. female with a past medical history significant for anemia, asthma, diverticulitis, fibroids, and GERD who presents to the ED due to worsening right leg pain x3 days.  Patient notes she has chronic right leg pain that has progressively worsened over the past 3 days.  Chart reviewed.  Patient was seen back in December and an x-ray of her back was performed which demonstrated moderate degenerative disc disease at L4-L5 and mild degenerative disc disease at the remaining lumbar levels.  Patient was referred to physical therapy for her pain. Patient notes 3 nights ago she turned and felt her right leg "give out" which caused her to fall backwards. Patient admits to hitting her head, but denies loss of consciousness. She is not currently on any blood thinners. Patient denies headache, nausea, vomiting, and changes to her vision. She continuously notes she is not worried about her fall, but just concerned about her right leg pain. Patient describes her pain a 10/10 sharp and achy in nature, worse with movement. Pain radiates from her right thigh all the way down to the her right foot. Patient notes this has been a chronic issue over the past few months that has just severely worsened over the past few days. Patient denies direct injury to right leg. Denies fever and chills. Denies low back pain. She notes intermittent numbness/tingling into right foot. Denies saddle paresthesias, bowel and bladder incontinence, IVDU, and lower extremity weakness.       Past Medical History:  Diagnosis Date  . Anemia   . Asthma   . Diverticulitis   . Fibroid tumor   . GERD (gastroesophageal reflux disease)     There are no problems to display for this patient.   No past surgical history on file.   OB  History   No obstetric history on file.     No family history on file.  Social History   Tobacco Use  . Smoking status: Current Every Day Smoker    Packs/day: 0.50    Types: Cigarettes  . Smokeless tobacco: Never Used  Substance Use Topics  . Alcohol use: Yes    Comment: occasional  . Drug use: No    Home Medications Prior to Admission medications   Medication Sig Start Date End Date Taking? Authorizing Provider  acetaminophen (TYLENOL) 325 MG tablet Take 2 tablets (650 mg total) by mouth every 6 (six) hours as needed. 03/05/19   Henderly, Britni A, PA-C  albuterol (VENTOLIN HFA) 108 (90 Base) MCG/ACT inhaler Inhale 1-2 puffs into the lungs every 4 (four) hours as needed for shortness of breath. 11/14/18   [provider]  diclofenac (VOLTAREN) 75 MG EC tablet Take 75 mg by mouth 2 (two) times daily. 01/25/19   [provider]  dicyclomine (BENTYL) 10 MG capsule Take 10 mg by mouth 3 (three) times daily as needed. 08/18/18 09/17/18  [provider]  FLUoxetine (PROZAC) 20 MG capsule Take 20 mg by mouth daily. 01/25/19   [provider]  fluticasone-salmeterol (ADVAIR HFA) 45-21 MCG/ACT inhaler Inhale 2 puffs into the lungs 2 (two) times daily. 11/14/18   [provider]  ibuprofen (ADVIL) 800 MG tablet Take 800 mg by mouth 3 (three) times daily as needed for pain. 02/03/19  [provider]  methocarbamol (ROBAXIN) 500 MG tablet Take 1 tablet (500 mg total) by mouth 2 (two) times daily. 06/29/17   Volanda Napoleon, PA-C  nitrofurantoin (MACRODANTIN) 100 MG capsule Take 100 mg by mouth at bedtime.    [provider]  ondansetron (ZOFRAN) 8 MG tablet Take 8 mg by mouth every 8 (eight) hours as needed for nausea or vomiting.  10/12/18   [provider]  tiZANidine (ZANAFLEX) 4 MG tablet Take 4 mg by mouth every 6 (six) hours as needed for spasms. 02/28/19   [provider]  vitamin B-12 (CYANOCOBALAMIN) 1000 MCG  tablet Take 1,000 mcg by mouth daily. 03/01/19   [provider]    Allergies    Patient has no known allergies.  Review of Systems   Review of Systems  Constitutional: Negative for chills and fever.  Eyes: Negative for visual disturbance.  Musculoskeletal: Positive for arthralgias, gait problem and myalgias. Negative for back pain and joint swelling.  Neurological: Positive for numbness. Negative for dizziness and headaches.    Physical Exam Updated Vital Signs BP 126/81 (BP Location: Right Arm)   Pulse 78   Temp 98.1 F (36.7 C) (Oral)   Resp 18   LMP  (LMP Unknown)   SpO2 98%   Physical Exam Vitals and nursing note reviewed.  Constitutional:      General: She is not in acute distress.    Appearance: She is not ill-appearing.  HENT:     Head: Normocephalic.  Eyes:     Pupils: Pupils are equal, round, and reactive to light.  Cardiovascular:     Rate and Rhythm: Normal rate and regular rhythm.     Pulses: Normal pulses.     Heart sounds: Normal heart sounds. No murmur. No friction rub. No gallop.   Pulmonary:     Effort: Pulmonary effort is normal.     Breath sounds: Normal breath sounds.  Abdominal:     General: Abdomen is flat. There is no distension.     Palpations: Abdomen is soft.     Tenderness: There is no abdominal tenderness. There is no right CVA tenderness, left CVA tenderness, guarding or rebound.  Musculoskeletal:     Cervical back: Neck supple.     Comments: Very mild tenderness to palpation over anterior aspect of right thigh. Full ROM of right hip, knee, and ankle. No erythema, warmth, or edema. No midline thoracic or lumbar tenderness. No thoracic or lumbar paraspinal tenderness. Distal pulses and sensation intact. Able to ambulate in the ED with mild limp.  Skin:    General: Skin is warm and dry.     Capillary Refill: Capillary refill takes less than 2 seconds.  Neurological:     General: No focal deficit present.     Mental Status: She is  alert.     Comments: Speech is clear, able to follow commands CN III-XII intact Normal strength in upper and lower extremities bilaterally including dorsiflexion and plantar flexion, strong and equal grip strength Sensation grossly intact throughout Moves extremities without ataxia, coordination intact No pronator drift Ambulates with mild limp   Psychiatric:        Mood and Affect: Mood normal.        Behavior: Behavior normal.     ED Results / Procedures / Treatments   Labs (all labs ordered are listed, but only abnormal results are displayed) Labs Reviewed - No data to display  EKG None  Radiology DG Pelvis 1-2  Views  Result Date: 05/15/2019 CLINICAL DATA:  44 year old female with chronic right hip pain. EXAM: RIGHT FEMUR 2 VIEWS; PELVIS - 1-2 VIEW COMPARISON:  Pelvic radiograph dated 08/27/2017. FINDINGS: There is no acute fracture or dislocation. The bones are well mineralized. Mild arthritic changes of the right hip with moderate narrowing of the superior joint space with associated cortical sclerosis and mild spurring, slightly progressed since the prior radiograph. The soft tissues are unremarkable. IMPRESSION: 1. No acute fracture or dislocation. 2. Slightly progressed arthritic changes of the right hip since the prior radiograph. Electronically Signed   By: Anner Crete M.D.   On: 05/15/2019 17:46   DG Femur Min 2 Views Right  Result Date: 05/15/2019 CLINICAL DATA:  44 year old female with chronic right hip pain. EXAM: RIGHT FEMUR 2 VIEWS; PELVIS - 1-2 VIEW COMPARISON:  Pelvic radiograph dated 08/27/2017. FINDINGS: There is no acute fracture or dislocation. The bones are well mineralized. Mild arthritic changes of the right hip with moderate narrowing of the superior joint space with associated cortical sclerosis and mild spurring, slightly progressed since the prior radiograph. The soft tissues are unremarkable. IMPRESSION: 1. No acute fracture or dislocation. 2. Slightly  progressed arthritic changes of the right hip since the prior radiograph. Electronically Signed   By: Anner Crete M.D.   On: 05/15/2019 17:46    Procedures Procedures (including critical care time)  Medications Ordered in ED Medications  lidocaine (LIDODERM) 5 % 2 patch (2 patches Transdermal Patch Applied 05/15/19 1656)  HYDROcodone-acetaminophen (NORCO/VICODIN) 5-325 MG per tablet 1 tablet (has no administration in time range)  ketorolac (TORADOL) 30 MG/ML injection 30 mg (30 mg Intramuscular Given 05/15/19 1653)    ED Course  I have reviewed the triage vital signs and the nursing notes.  Pertinent labs & imaging results that were available during my care of the patient were reviewed by me and considered in my medical decision making (see chart for details).    MDM Rules/Calculators/A&P                     44 year old female presents to the ED due to acute on chronic right leg pain that has severely worsened over the past 3 days.  Patient admits to falling 3 nights ago when her right leg "gave out".  Patient admits to hitting her head, but denies loss of consciousness.  She is not currently on any blood thinners.  Vitals all within normal limits.  Patient in no acute distress and non-ill-appearing.  Physical exam reassuring with very mild tenderness to palpation on the anterior aspect of the right thigh.  Normal range of motion of right hip knee and ankle.  No erythema, edema, or warmth.  Doubt septic arthritis of the right hip.  Right lower extremity neurovascularly intact.  Patient able to ambulate in the ED with mild limp.  Normal neurological exam.  Doubt acute intracranial abnormalities from her fall.  Will treat pain with Toradol and Lidoderm patches.  Reviewed chart and patient's last visit here in the ED patient had normal renal function.  Will obtain x-ray of femur and pelvis to rule out any bony fractures which I find to be very unlikely given the pain is more chronic in  nature.  X-rays personally reviewed which are negative for bony fractures, but demonstrated worsening right arthritic hip changes from prior x-ray.  Will treat patient symptomatically with orthopedic follow-up. Strict ED precautions discussed with patient. Patient states understanding and agrees to plan. Patient discharged  home in no acute distress and stable vitals.  Final Clinical Impression(s) / ED Diagnoses Final diagnoses:  Right leg pain  Right hip pain    Rx / DC Orders ED Discharge Orders    None       Suzy Bouchard, PA-C 05/15/19 [place], Ankit, MD 05/20/19 1744

## 2019-05-15 NOTE — Discharge Instructions (Addendum)
As discussed, your x-rays were negative for any broken bones, but it showed worsening arthritis in your right hip which I suspect is causing most of your pain. You may take over the counter Tylenol in addition to your other pain medications. You may purchase over the counter Voltaren gel or lidoderm patches as needed for pain. Call the orthopedic office tomorrow morning to schedule an appointment. Return to the ER for new or worsening symptoms.

## 2019-05-15 NOTE — ED Notes (Signed)
Pt states that she fell 2 days ago, hit her head but was never seen by MD

## 2019-05-15 NOTE — ED Notes (Signed)
Patient verbalizes understanding of discharge instructions. Opportunity for questioning and answers were provided. Armband removed by staff, pt discharged from ED to home with boyfriend

## 2019-06-13 ENCOUNTER — Encounter (HOSPITAL_COMMUNITY): Payer: Self-pay

## 2019-06-13 ENCOUNTER — Other Ambulatory Visit: Payer: Self-pay

## 2019-06-13 ENCOUNTER — Emergency Department (HOSPITAL_COMMUNITY)
Admission: EM | Admit: 2019-06-13 | Discharge: 2019-06-13 | Disposition: A | Discharge status: Home / Self Care | Payer: 59 | Attending: Emergency Medicine | Admitting: Emergency Medicine

## 2019-06-13 DIAGNOSIS — M79604 Pain in right leg: Secondary | ICD-10-CM | POA: Insufficient documentation

## 2019-06-13 DIAGNOSIS — F17210 Nicotine dependence, cigarettes, uncomplicated: Secondary | ICD-10-CM | POA: Insufficient documentation

## 2019-06-13 DIAGNOSIS — Z79899 Other long term (current) drug therapy: Secondary | ICD-10-CM | POA: Insufficient documentation

## 2019-06-13 DIAGNOSIS — N3090 Cystitis, unspecified without hematuria: Secondary | ICD-10-CM | POA: Diagnosis not present

## 2019-06-13 DIAGNOSIS — G8929 Other chronic pain: Secondary | ICD-10-CM | POA: Diagnosis not present

## 2019-06-13 LAB — URINALYSIS, ROUTINE W REFLEX MICROSCOPIC
Bacteria, UA: NONE SEEN
Bilirubin Urine: NEGATIVE
Glucose, UA: NEGATIVE mg/dL
Hgb urine dipstick: NEGATIVE
Ketones, ur: NEGATIVE mg/dL
Nitrite: NEGATIVE
Protein, ur: NEGATIVE mg/dL
Specific Gravity, Urine: 1.03 (ref 1.005–1.030)
pH: 5 (ref 5.0–8.0)

## 2019-06-13 MED ORDER — PREDNISONE 20 MG PO TABS
60.0000 mg | ORAL_TABLET | Freq: Once | ORAL | Status: AC
  Administered 2019-06-13: 60 mg via ORAL
  Filled 2019-06-13: qty 3

## 2019-06-13 MED ORDER — CEPHALEXIN 500 MG PO CAPS: 500.0000 mg | ORAL_CAPSULE | Freq: Two times a day (BID) | ORAL | 0 refills | Status: AC

## 2019-06-13 MED ORDER — PREDNISONE 10 MG (21) PO TBPK: ORAL_TABLET | Freq: Every day | ORAL | 0 refills | Status: DC

## 2019-06-13 MED ORDER — KETOROLAC TROMETHAMINE 30 MG/ML IJ SOLN
30.0000 mg | Freq: Once | INTRAMUSCULAR | Status: AC
  Administered 2019-06-13: 30 mg via INTRAMUSCULAR
  Filled 2019-06-13: qty 1

## 2019-06-13 MED ORDER — KETOROLAC TROMETHAMINE 30 MG/ML IJ SOLN: 30.0000 mg | Freq: Once | INTRAMUSCULAR | Status: DC

## 2019-06-13 NOTE — ED Notes (Signed)
Patient verbalizes understanding of discharge instructions. Opportunity for questioning and answers were provided. Pt discharged from ED. 

## 2019-06-13 NOTE — ED Provider Notes (Signed)
[facility] EMERGENCY DEPARTMENT Provider Note   CSN: [id number] Arrival date & time: 06/13/19  [id number]     History Chief Complaint  Patient presents with  . Leg Pain    Jessica Gallagher is a 44 y.o. female.  HPI    44 year old female with history of anemia, asthma, diverticulitis, fibroids, GERD, chronic leg pain, who presents to the emergency department today for evaluation of acute on chronic leg pain.  States she has a many year history of chronic pain to the right lower extremity radiating in the upper thigh and radiating to the lower leg.  Pain feels sharp, stabbing, burning, aching.  It is severe in nature.  It is worsened with certain movement, positions and ambulation.  She also reports numbness to the foot which has been present for at least the last year.  She reports intermittent weakness stating that her leg gives out on her.  She has been seen by her PCP, orthopedics and has had physical therapy.  She is currently on diclofenac, meloxicam and Robaxin and has had no relief.    Denies saddle anesthesia. Denies loss of control of bowels or bladder. No urinary retention. No fevers. Denies a h/o IVDU. Denies a h/o CA or recent unintended weight loss.  Patient also notes that she has had some dysuria for the last few days and is concerned that she has a UTI.  Denies other systemic symptoms.  Past Medical History:  Diagnosis Date  . Anemia   . Asthma   . Diverticulitis   . Fibroid tumor   . GERD (gastroesophageal reflux disease)     There are no problems to display for this patient.   History reviewed. No pertinent surgical history.   OB History   No obstetric history on file.     No family history on file.  Social History   Tobacco Use  . Smoking status: Current Every Day Smoker    Packs/day: 0.50    Types: Cigarettes  . Smokeless tobacco: Never Used  Substance Use Topics  . Alcohol use: Yes    Comment: occasional  . Drug use: No    Home  Medications Prior to Admission medications   Medication Sig Start Date End Date Taking? Authorizing Provider  acetaminophen (TYLENOL) 325 MG tablet Take 2 tablets (650 mg total) by mouth every 6 (six) hours as needed. 03/05/19   Henderly, Britni A, PA-C  albuterol (VENTOLIN HFA) 108 (90 Base) MCG/ACT inhaler Inhale 1-2 puffs into the lungs every 4 (four) hours as needed for shortness of breath. 11/14/18   [provider]  cephALEXin (KEFLEX) 500 MG capsule Take 1 capsule (500 mg total) by mouth 2 (two) times daily for 7 days. 06/13/19 06/20/19  Ipek Westra S, PA-C  diclofenac (VOLTAREN) 75 MG EC tablet Take 75 mg by mouth 2 (two) times daily. 01/25/19   [provider]  dicyclomine (BENTYL) 10 MG capsule Take 10 mg by mouth 3 (three) times daily as needed. 08/18/18 09/17/18  [provider]  FLUoxetine (PROZAC) 20 MG capsule Take 20 mg by mouth daily. 01/25/19   [provider]  fluticasone-salmeterol (ADVAIR HFA) 45-21 MCG/ACT inhaler Inhale 2 puffs into the lungs 2 (two) times daily. 11/14/18   [provider]  ibuprofen (ADVIL) 800 MG tablet Take 800 mg by mouth 3 (three) times daily as needed for pain. 02/03/19   [provider]  methocarbamol (ROBAXIN) 500 MG tablet Take 1 tablet (500 mg total) by mouth 2 (  two) times daily. 06/29/17   Volanda Napoleon, PA-C  nitrofurantoin (MACRODANTIN) 100 MG capsule Take 100 mg by mouth at bedtime.    [provider]  ondansetron (ZOFRAN) 8 MG tablet Take 8 mg by mouth every 8 (eight) hours as needed for nausea or vomiting.  10/12/18   [provider]  predniSONE (STERAPRED UNI-PAK 21 TAB) 10 MG (21) TBPK tablet Take by mouth daily. Take 6 tabs by mouth daily  for 2 days, then 5 tabs for 2 days, then 4 tabs for 2 days, then 3 tabs for 2 days, 2 tabs for 2 days, then 1 tab by mouth daily for 2 days 06/13/19   Madoc Holquin S, PA-C  tiZANidine (ZANAFLEX) 4 MG tablet Take 4 mg by mouth every 6 (six)  hours as needed for spasms. 02/28/19   [provider]  vitamin B-12 (CYANOCOBALAMIN) 1000 MCG tablet Take 1,000 mcg by mouth daily. 03/01/19   [provider]    Allergies    Patient has no known allergies.  Review of Systems   Review of Systems  Constitutional: Negative for fever.  Respiratory: Negative for shortness of breath.   Cardiovascular: Negative for chest pain.  Gastrointestinal: Negative for abdominal pain, blood in stool, constipation, diarrhea, nausea and vomiting.  Genitourinary: Positive for dysuria. Negative for frequency, hematuria and urgency.  Musculoskeletal: Positive for back pain. Negative for gait problem.  Skin: Negative for wound.  Neurological: Positive for weakness and numbness (chronic, unchanged).    Physical Exam Updated Vital Signs BP (!) 108/59   Pulse 70   Temp 98.3 F (36.8 C) (Oral)   Resp 18   Ht 5\' 9"  (1.753 m)   Wt 95.3 kg   LMP  (LMP Unknown)   SpO2 100%   BMI 31.01 kg/m   Physical Exam Vitals and nursing note reviewed.  Constitutional:      General: She is not in acute distress.    Appearance: She is well-developed.  HENT:     Head: Normocephalic and atraumatic.  Eyes:     Conjunctiva/sclera: Conjunctivae normal.  Cardiovascular:     Rate and Rhythm: Normal rate and regular rhythm.     Heart sounds: No murmur.  Pulmonary:     Effort: Pulmonary effort is normal. No respiratory distress.     Breath sounds: Normal breath sounds.  Abdominal:     General: Bowel sounds are normal.     Palpations: Abdomen is soft.     Tenderness: There is no abdominal tenderness. There is no right CVA tenderness or left CVA tenderness.  Musculoskeletal:     Cervical back: Neck supple.     Comments: No midline TTP to the lumbar spine. No reproducible TTP to the RLE. No calf TTP. No swelling noted. 5/5 strength to BLE with flexion/extension/abduction/adduction at hips, flexion/extension at knees, and dorsiflexion/plantarflexion of  feet. Sensation intact and symmetric bilaterally. Pt ambulatory with slight limp.    Skin:    General: Skin is warm and dry.  Neurological:     Mental Status: She is alert.     ED Results / Procedures / Treatments   Labs (all labs ordered are listed, but only abnormal results are displayed) Labs Reviewed  URINALYSIS, ROUTINE W REFLEX MICROSCOPIC - Abnormal; Notable for the following components:      Result Value   Leukocytes,Ua SMALL (*)    All other components within normal limits    EKG None  Radiology No results found.  Procedures Procedures (including  critical care time)  Medications Ordered in ED Medications  predniSONE (DELTASONE) tablet 60 mg (60 mg Oral Given 06/13/19 1052)  ketorolac (TORADOL) 30 MG/ML injection 30 mg (30 mg Intramuscular Given 06/13/19 1123)    ED Course  I have reviewed the triage vital signs and the nursing notes.  Pertinent labs & imaging results that were available during my care of the patient were reviewed by me and considered in my medical decision making (see chart for details).    MDM Rules/Calculators/A&P                      44 year old female presenting with acute exacerbation of chronic right lower extremity pain.  Reviewed records, patient has longstanding history of chronic leg pain that has been worked up in the ED multiple times.  She has had x-rays completed as well as an ultrasound of the right lower extremity showing no evidence of DVT.  She is currently in the process of getting scheduled for an MRI.  She was recently seen in the ED 05/15/2019 and had x-ray of the pelvis which showed a slightly progressed arthritic changes of the right hip.  She also had CT abdomen/pelvis on 10/20/2018 which showed degenerative changes of the lumbar spine.  Exam is reassuring and without evidence of neurologic deficit.  Exam not suggestive of DVT.  This seems to be a chronic issue for the patient.    She also mentions some urinary symptoms.   UA with small leukocytes, 6-10 RBC, 11-20 WBC and no bacteria. Will tx with keflex.  She states she has not trialed prednisone taper, will give Rx for this.  Advised continued follow-up with physical therapy and orthopedics.  Advised on specific return precautions.  She voices understanding of the plan and reasons to return.  All questions answered.  Patient stable for discharge.  Final Clinical Impression(s) / ED Diagnoses Final diagnoses:  Chronic pain of right lower extremity  Cystitis    Rx / DC Orders ED Discharge Orders         Ordered    predniSONE (STERAPRED UNI-PAK 21 TAB) 10 MG (21) TBPK tablet  Daily,   Status:  Discontinued     06/13/19 1045    cephALEXin (KEFLEX) 500 MG capsule  2 times daily     06/13/19 1126    predniSONE (STERAPRED UNI-PAK 21 TAB) 10 MG (21) TBPK tablet  Daily     06/13/19 1128           Quantae Martel S, PA-C 06/13/19 [place], [place], DO 06/13/19 1635

## 2019-06-13 NOTE — ED Triage Notes (Signed)
Pt returns for chronic right leg pain, followed up with ortho but having issues with her insurance. Pt taking diclofenac, meloxicam and robaxin without relief. Pt ambulatory.

## 2019-06-13 NOTE — Discharge Instructions (Addendum)
Take prednisone as directed. You may also start taking Tylenol as needed for pain control. You may take [id number] mg of Tylenol every 6 hours. Do not exceed 4000 mg of Tylenol daily as this can lead to liver damage. You may use warm and cold compresses to help with your symptoms.   You were given a prescription for antibiotics for a urinary tract infection. Please take the antibiotic prescription fully.   Please call your orthopedic doctor or your primary care doctor to schedule an appointment for follow-up.  Return to the emergency department immediately if you experience any back pain associated with fevers, loss of control of your bowels/bladder, weakness/numbness to your legs, numbness to your groin area, inability to walk, or inability to urinate.

## 2019-07-12 ENCOUNTER — Other Ambulatory Visit: Payer: Self-pay | Admitting: Orthopedic Surgery

## 2019-08-03 ENCOUNTER — Other Ambulatory Visit: Payer: Self-pay

## 2019-08-03 ENCOUNTER — Emergency Department (HOSPITAL_COMMUNITY)
Admission: EM | Admit: 2019-08-03 | Discharge: 2019-08-03 | Disposition: A | Discharge status: Home / Self Care | Payer: Medicaid Other | Attending: Emergency Medicine | Admitting: Emergency Medicine

## 2019-08-03 ENCOUNTER — Encounter (HOSPITAL_COMMUNITY): Payer: Self-pay | Admitting: *Deleted

## 2019-08-03 DIAGNOSIS — G8929 Other chronic pain: Secondary | ICD-10-CM | POA: Insufficient documentation

## 2019-08-03 DIAGNOSIS — M79604 Pain in right leg: Secondary | ICD-10-CM | POA: Diagnosis not present

## 2019-08-03 DIAGNOSIS — J302 Other seasonal allergic rhinitis: Secondary | ICD-10-CM | POA: Diagnosis not present

## 2019-08-03 DIAGNOSIS — J45909 Unspecified asthma, uncomplicated: Secondary | ICD-10-CM | POA: Insufficient documentation

## 2019-08-03 DIAGNOSIS — F17210 Nicotine dependence, cigarettes, uncomplicated: Secondary | ICD-10-CM | POA: Diagnosis not present

## 2019-08-03 DIAGNOSIS — M545 Low back pain: Secondary | ICD-10-CM | POA: Diagnosis present

## 2019-08-03 DIAGNOSIS — M5441 Lumbago with sciatica, right side: Secondary | ICD-10-CM | POA: Diagnosis not present

## 2019-08-03 MED ORDER — LIDOCAINE 5 % EX PTCH
1.0000 | MEDICATED_PATCH | CUTANEOUS | Status: DC
  Administered 2019-08-03: 08:00:00 1 via TRANSDERMAL
  Filled 2019-08-03: qty 1

## 2019-08-03 MED ORDER — CYCLOBENZAPRINE HCL 10 MG PO TABS: 10.0000 mg | ORAL_TABLET | Freq: Two times a day (BID) | ORAL | 0 refills | Status: AC | PRN

## 2019-08-03 MED ORDER — LIDOCAINE VISCOUS HCL 2 % MT SOLN
15.0000 mL | Freq: Once | OROMUCOSAL | Status: AC
  Administered 2019-08-03: 09:00:00 15 mL via OROMUCOSAL
  Filled 2019-08-03: qty 15

## 2019-08-03 MED ORDER — CETIRIZINE HCL 10 MG PO TABS: 10.0000 mg | ORAL_TABLET | Freq: Every day | ORAL | 0 refills | Status: AC

## 2019-08-03 MED ORDER — CYCLOBENZAPRINE HCL 10 MG PO TABS
10.0000 mg | ORAL_TABLET | Freq: Once | ORAL | Status: AC
  Administered 2019-08-03: 10 mg via ORAL
  Filled 2019-08-03: qty 1

## 2019-08-03 MED ORDER — PREDNISONE 10 MG (21) PO TBPK: ORAL_TABLET | Freq: Every day | ORAL | 0 refills | Status: DC

## 2019-08-03 MED ORDER — NAPROXEN 500 MG PO TABS: 500.0000 mg | ORAL_TABLET | Freq: Two times a day (BID) | ORAL | 0 refills | Status: AC

## 2019-08-03 MED ORDER — IBUPROFEN 400 MG PO TABS
600.0000 mg | ORAL_TABLET | Freq: Once | ORAL | Status: AC
  Administered 2019-08-03: 600 mg via ORAL
  Filled 2019-08-03: qty 1

## 2019-08-03 MED ORDER — BENZONATATE 100 MG PO CAPS: 100.0000 mg | ORAL_CAPSULE | Freq: Three times a day (TID) | ORAL | 0 refills | Status: AC

## 2019-08-03 NOTE — Discharge Instructions (Addendum)
As discussed, I am sending you home with steroids, pain medication, and a muscle relaxer.  Please take as prescribed.  Do not mix with other over-the-counter pain medications.  I recommend you call your orthopedist today for further evaluation and for recommendations on pain management.  I have included low back exercises.  He may also purchase over-the-counter Lidoderm patches and Voltaren gel as needed for added pain relief.  Return to the ER for new or worsening symptoms.

## 2019-08-03 NOTE — ED Triage Notes (Signed)
THE PT IS C/O LOWER BACK PAIN AND RT LEG PAIN  SHE HAS HAD THIS FOR AWHILE   SHE IS SCHEDULED FOR BACK SURGERY April 21  LMP NONE

## 2019-08-03 NOTE — ED Notes (Signed)
Pt discharged at this time. Discharge instructions reviewed, opportunity to ask questions provided, pt verbalizes understanding of discharge instructions.   

## 2019-08-03 NOTE — ED Notes (Signed)
Pt ambulated in hall by Dian Situ, ED Tech, pt was able to ambulate with a steady gait and no assistance. PA made aware.

## 2019-08-03 NOTE — ED Provider Notes (Signed)
[facility] EMERGENCY DEPARTMENT Provider Note   CSN: [id number] Arrival date & time: 08/03/19  [id number]     History Chief Complaint  Patient presents with  . Back Pain    Jessica Gallagher is a 44 y.o. female with a past medical history significant for anemia, asthma, history of diverticulitis, fibroids, GERD, and chronic low back pain who presents to the ED due to acute on chronic right low back pain that radiates into her right leg.  Patient states pain has been there for numerous years.  She currently has back surgery scheduled for 08/15/2017.  Patient notes she is only taking Tylenol for her pain at this time.  Denies any back injury.  She notes pain is worse with movement.  She also reports intermittent numbness/tingling down her right leg.  Denies saddle paresthesias, bowel/bladder incontinence, history of cancer, trauma, IV drug use, and fever/chills.  Denies history of blood clots, recent surgeries, recent long immobilizations, lower extremity edema, and hormonal treatments.  History obtained from patient and past medical records. No interpreter used during encounter.      Past Medical History:  Diagnosis Date  . Anemia   . Asthma   . Diverticulitis   . Fibroid tumor   . GERD (gastroesophageal reflux disease)     There are no problems to display for this patient.   History reviewed. No pertinent surgical history.   OB History   No obstetric history on file.     No family history on file.  Social History   Tobacco Use  . Smoking status: Current Every Day Smoker    Packs/day: 0.50    Types: Cigarettes  . Smokeless tobacco: Never Used  Substance Use Topics  . Alcohol use: Yes    Comment: occasional  . Drug use: No    Home Medications Prior to Admission medications   Medication Sig Start Date End Date Taking? Authorizing Provider  acetaminophen (TYLENOL) 325 MG tablet Take 2 tablets (650 mg total) by mouth every 6 (six) hours as needed. 03/05/19    Henderly, Britni A, PA-C  albuterol (VENTOLIN HFA) 108 (90 Base) MCG/ACT inhaler Inhale 1-2 puffs into the lungs every 4 (four) hours as needed for shortness of breath. 11/14/18   [provider]  benzonatate (TESSALON) 100 MG capsule Take 1 capsule (100 mg total) by mouth every 8 (eight) hours. 08/03/19   Suzy Bouchard, PA-C  cetirizine (ZYRTEC ALLERGY) 10 MG tablet Take 1 tablet (10 mg total) by mouth daily. 08/03/19   Suzy Bouchard, PA-C  cyclobenzaprine (FLEXERIL) 10 MG tablet Take 1 tablet (10 mg total) by mouth 2 (two) times daily as needed for muscle spasms. 08/03/19   Suzy Bouchard, PA-C  dicyclomine (BENTYL) 10 MG capsule Take 10 mg by mouth 3 (three) times daily as needed. 08/18/18 09/17/18  [provider]  fluticasone-salmeterol (ADVAIR HFA) 45-21 MCG/ACT inhaler Inhale 2 puffs into the lungs 2 (two) times daily. 11/14/18   [provider]  methocarbamol (ROBAXIN) 500 MG tablet Take 1 tablet (500 mg total) by mouth 2 (two) times daily. Patient not taking: Reported on 08/02/2019 06/29/17   Providence Lanius A, PA-C  naproxen (NAPROSYN) 500 MG tablet Take 1 tablet (500 mg total) by mouth 2 (two) times daily. 08/03/19   Suzy Bouchard, PA-C  predniSONE (STERAPRED UNI-PAK 21 TAB) 10 MG (21) TBPK tablet Take by mouth daily. Take 6 tabs by mouth daily  for 2 days, then 5 tabs for 2 days,  then 4 tabs for 2 days, then 3 tabs for 2 days, 2 tabs for 2 days, then 1 tab by mouth daily for 2 days Patient not taking: Reported on 08/02/2019 06/13/19   Couture, Cortni S, PA-C  predniSONE (STERAPRED UNI-PAK 21 TAB) 10 MG (21) TBPK tablet Take by mouth daily. Take 6 tabs by mouth daily  for 1 days, then 5 tabs for 1 days, then 4 tabs for 1 days, then 3 tabs for 1 days, 2 tabs for 1 days, then 1 tab by mouth daily for 1 days 08/03/19   Suzy Bouchard, PA-C    Allergies    Patient has no known allergies.  Review of Systems   Review of Systems  Constitutional: Negative for  chills and fever.  Musculoskeletal: Positive for arthralgias, back pain and gait problem. Negative for joint swelling.  Skin: Negative for wound.  Neurological: Positive for numbness.    Physical Exam Updated Vital Signs BP 118/74 (BP Location: Left Arm)   Pulse 91   Temp 98.1 F (36.7 C) (Oral)   Resp 16   Ht 5\' 9"  (1.753 m)   Wt 94.8 kg   LMP  (LMP Unknown)   SpO2 98%   BMI 30.86 kg/m   Physical Exam Vitals and nursing note reviewed.  Constitutional:      General: She is not in acute distress.    Appearance: She is not ill-appearing.  HENT:     Head: Normocephalic.  Eyes:     Pupils: Pupils are equal, round, and reactive to light.  Cardiovascular:     Rate and Rhythm: Normal rate and regular rhythm.     Pulses: Normal pulses.     Heart sounds: Normal heart sounds. No murmur. No friction rub. No gallop.   Pulmonary:     Effort: Pulmonary effort is normal.     Breath sounds: Normal breath sounds.  Abdominal:     General: Abdomen is flat. There is no distension.     Palpations: Abdomen is soft.     Tenderness: There is no abdominal tenderness. There is no guarding or rebound.  Musculoskeletal:     Cervical back: Neck supple.     Comments: No T-spine and L-spine midline tenderness, no stepoff or deformity, no paraspinal tenderness +rigth straight leg test No leg edema bilaterally Patient moves all extremities without difficulty with slight decreased strength of RLE DP/PT pulses 2+ and equal bilaterally Sensation grossly intact bilaterally Strength of knee flexion and extension is 5/5 Plantar and dorsiflexion of ankle 5/5 Negative homan's sign bilaterally.   Skin:    General: Skin is warm and dry.  Neurological:     General: No focal deficit present.     Mental Status: She is alert.  Psychiatric:        Mood and Affect: Mood normal.        Behavior: Behavior normal.     ED Results / Procedures / Treatments   Labs (all labs ordered are listed, but only  abnormal results are displayed) Labs Reviewed - No data to display  EKG None  Radiology No results found.  Procedures Procedures (including critical care time)  Medications Ordered in ED Medications  lidocaine (LIDODERM) 5 % 1 patch (1 patch Transdermal Patch Applied 08/03/19 0808)  cyclobenzaprine (FLEXERIL) tablet 10 mg (10 mg Oral Given 08/03/19 0807)  ibuprofen (ADVIL) tablet 600 mg (600 mg Oral Given 08/03/19 0808)  lidocaine (XYLOCAINE) 2 % viscous mouth solution 15 mL (15 mLs Mouth/Throat Given  08/03/19 0915)    ED Course  I have reviewed the triage vital signs and the nursing notes.  Pertinent labs & imaging results that were available during my care of the patient were reviewed by me and considered in my medical decision making (see chart for details).    MDM Rules/Calculators/A&P                     44 year old female presents to the ED due to acute on chronic right lower extremity pain.  Patient has been evaluated numerous times in the ED and with orthopedics.  Patient has a scheduled back surgery on 08/16/2019.  Denies any injury.  No low back red flags.  No concern for cauda equina or central cord compression.  Vitals all within normal limits.  Patient is afebrile.  Patient no acute distress and non-ill-appearing.  No midline tenderness.  No neurological deficits appreciated on exam.  Distal sensation and pulses intact.  Presentation not concerning for DVT.  Patient notes this feels the same as her chronic pain. She notes prednisone has helped drastically in the past. No history of diabetes. Will treat symptomatically at this time with prednisone, naproxen, and flexeril. Patient able to ambulate here in the ED without difficulty. Advised patient to call her orthopedist for further evaluation. Strict ED precautions discussed with patient. Patient states understanding and agrees to plan. Patient discharged home in no acute distress and stable vitals.  8:33 AM informed by RN that  patient would like to speak to me. Patient notes her throat has been scratchy over the past few days associated with hoarse voice. Very mild erythema in throat. No tonsillar hypertrophy or abscess appreciated on exam. Admits to history of allergies. No fever. Will send patient home with Zyrtec.    Final Clinical Impression(s) / ED Diagnoses Final diagnoses:  Chronic right-sided low back pain with right-sided sciatica  Chronic pain of right lower extremity  Seasonal allergies    Rx / DC Orders ED Discharge Orders         Ordered    predniSONE (STERAPRED UNI-PAK 21 TAB) 10 MG (21) TBPK tablet  Daily     08/03/19 0801    naproxen (NAPROSYN) 500 MG tablet  2 times daily     08/03/19 0801    cyclobenzaprine (FLEXERIL) 10 MG tablet  2 times daily PRN     08/03/19 0801    cetirizine (ZYRTEC ALLERGY) 10 MG tablet  Daily     08/03/19 0835    benzonatate (TESSALON) 100 MG capsule  Every 8 hours     08/03/19 0835           Suzy Bouchard, PA-C 08/03/19 [id number]    Lucrezia Starch, MD 08/07/19 [phone]

## 2019-08-11 NOTE — Progress Notes (Signed)
[place], [place] - [id number] [place] [place] [address] Phone: [phone] Fax: [phone]      Your procedure is scheduled on Wednesday, August 16, 2019.  Report to [facility] Main Entrance "A" at 9:40 A.M., and check in at the Admitting office.  Call this number if you have problems the morning of surgery:  [phone]  Call [phone] if you have any questions prior to your surgery date Monday-Friday 8am-4pm    Remember:  Do not eat after midnight the night before your surgery  You may drink clear liquids until 8:40 AM the morning of your surgery.   Clear liquids allowed are: Water, Non-Citrus Juices (without pulp), Carbonated Beverages, Clear Tea, Black Coffee Only, and Gatorade  Please complete your PRE-SURGERY ENSURE that was provided to you by 8:40 AM the morning of surgery.  Please, if able, drink it in one setting. DO NOT SIP.     Take these medicines the morning of surgery with A SIP OF WATER:  benzonatate (TESSALON)  cetirizine (ZYRTEC ALLERGY) fluticasone-salmeterol (ADVAIR HFA) predniSONE (STERAPRED UNI-PAK 21 TAB)  If needed: acetaminophen (TYLENOL) albuterol (VENTOLIN HFA) cyclobenzaprine (FLEXERIL)  dicyclomine (BENTYL)  As of today, STOP taking any Aspirin (unless otherwise instructed by your surgeon) and Aspirin containing products, Aleve, Naproxen, Ibuprofen, Motrin, Advil, Goody's, BC's, all herbal medications, fish oil, and all vitamins.                      Do not wear jewelry, make up, or nail polish            Do not wear lotions, powders, perfumes, or deodorant.            Do not shave 48 hours prior to surgery.            Do not bring valuables to the hospital.            [facility] is not responsible for any belongings or valuables.  Do NOT Smoke (Tobacco/Vapping) or drink Alcohol 24 hours prior to your procedure If you use a CPAP at night, you may bring all equipment for your  overnight stay.   Contacts, glasses, dentures or bridgework may not be worn into surgery.      For patients admitted to the hospital, discharge time will be determined by your treatment team.   Patients discharged the day of surgery will not be allowed to drive home, and someone needs to stay with them for 24 hours.    Special instructions:   [facility]- Preparing For Surgery  Before surgery, you can play an important role. Because skin is not sterile, your skin needs to be as free of germs as possible. You can reduce the number of germs on your skin by washing with CHG (chlorahexidine gluconate) Soap before surgery.  CHG is an antiseptic cleaner which kills germs and bonds with the skin to continue killing germs even after washing.    Oral Hygiene is also important to reduce your risk of infection.  Remember - BRUSH YOUR TEETH THE MORNING OF SURGERY WITH YOUR REGULAR TOOTHPASTE  Please do not use if you have an allergy to CHG or antibacterial soaps. If your skin becomes reddened/irritated stop using the CHG.  Do not shave (including legs and underarms) for at least 48 hours prior to first CHG shower. It is OK to shave your face.  Please follow these instructions carefully.   1. Shower the NIGHT BEFORE  SURGERY and the MORNING OF SURGERY with CHG Soap.   2. If you chose to wash your hair, wash your hair first as usual with your normal shampoo.  3. After you shampoo, rinse your hair and body thoroughly to remove the shampoo.  4. Use CHG as you would any other liquid soap. You can apply CHG directly to the skin and wash gently with a scrungie or a clean washcloth.   5. Apply the CHG Soap to your body ONLY FROM THE NECK DOWN.  Do not use on open wounds or open sores. Avoid contact with your eyes, ears, mouth and genitals (private parts). Wash Face and genitals (private parts)  with your normal soap.   6. Wash thoroughly, paying special attention to the area where your surgery will be  performed.  7. Thoroughly rinse your body with warm water from the neck down.  8. DO NOT shower/wash with your normal soap after using and rinsing off the CHG Soap.  9. Pat yourself dry with a CLEAN TOWEL.  10. Wear CLEAN PAJAMAS to bed the night before surgery, wear comfortable clothes the morning of surgery  11. Place CLEAN SHEETS on your bed the night of your first shower and DO NOT SLEEP WITH PETS.   Day of Surgery:   Do not apply any deodorants/lotions.  Please wear clean clothes to the hospital/surgery center.   Remember to brush your teeth WITH YOUR REGULAR TOOTHPASTE.   Please read over the following fact sheets that you were given.

## 2019-08-14 ENCOUNTER — Inpatient Hospital Stay (HOSPITAL_COMMUNITY): Admission: RE | Admit: 2019-08-14 | Payer: 59 | Source: Ambulatory Visit

## 2019-08-14 ENCOUNTER — Inpatient Hospital Stay (HOSPITAL_COMMUNITY)
Admission: RE | Admit: 2019-08-14 | Discharge: 2019-08-14 | Disposition: A | Discharge status: Home / Self Care | Payer: 59 | Source: Ambulatory Visit

## 2019-08-15 ENCOUNTER — Other Ambulatory Visit: Payer: Self-pay

## 2019-08-15 ENCOUNTER — Encounter (HOSPITAL_COMMUNITY): Payer: Self-pay

## 2019-08-15 ENCOUNTER — Encounter (HOSPITAL_COMMUNITY)
Admission: RE | Admit: 2019-08-15 | Discharge: 2019-08-15 | Disposition: A | Discharge status: Home / Self Care | Payer: Medicaid Other | Source: Ambulatory Visit | Attending: Orthopedic Surgery | Admitting: Orthopedic Surgery

## 2019-08-15 DIAGNOSIS — Z01812 Encounter for preprocedural laboratory examination: Secondary | ICD-10-CM | POA: Diagnosis present

## 2019-08-15 HISTORY — DX: Other specified postprocedural states: Z98.890

## 2019-08-15 HISTORY — DX: Anxiety disorder, unspecified: F41.9

## 2019-08-15 HISTORY — DX: Unspecified osteoarthritis, unspecified site: M19.90

## 2019-08-15 HISTORY — DX: Depression, unspecified: F32.A

## 2019-08-15 HISTORY — DX: Other specified postprocedural states: R11.2

## 2019-08-15 HISTORY — DX: Dyspnea, unspecified: R06.00

## 2019-08-15 HISTORY — DX: Fibromyalgia: M79.7

## 2019-08-15 LAB — COMPREHENSIVE METABOLIC PANEL
ALT: 16 U/L (ref 0–44)
AST: 23 U/L (ref 15–41)
Albumin: 3.8 g/dL (ref 3.5–5.0)
Alkaline Phosphatase: 47 U/L (ref 38–126)
Anion gap: 9 (ref 5–15)
BUN: 6 mg/dL (ref 6–20)
CO2: 23 mmol/L (ref 22–32)
Calcium: 9 mg/dL (ref 8.9–10.3)
Chloride: 107 mmol/L (ref 98–111)
Creatinine, Ser: 0.69 mg/dL (ref 0.44–1.00)
GFR calc Af Amer: >60 mL/min (ref >60)
GFR calc non Af Amer: >60 mL/min (ref >60)
Glucose, Bld: 103 mg/dL — ABNORMAL HIGH (ref 70–99)
Potassium: 4 mmol/L (ref 3.5–5.1)
Sodium: 139 mmol/L (ref 135–145)
Total Bilirubin: 0.7 mg/dL (ref 0.3–1.2)
Total Protein: 7.1 g/dL (ref 6.5–8.1)

## 2019-08-15 LAB — URINALYSIS, ROUTINE W REFLEX MICROSCOPIC
Bacteria, UA: NONE SEEN
Bilirubin Urine: NEGATIVE
Glucose, UA: NEGATIVE mg/dL
Ketones, ur: NEGATIVE mg/dL
Leukocytes,Ua: NEGATIVE
Nitrite: NEGATIVE
Protein, ur: NEGATIVE mg/dL
Specific Gravity, Urine: 1.021 (ref 1.005–1.030)
pH: 5 (ref 5.0–8.0)

## 2019-08-15 LAB — CBC WITH DIFFERENTIAL/PLATELET
Abs Immature Granulocytes: 0.03 10*3/uL (ref 0.00–0.07)
Basophils Absolute: 0.1 10*3/uL (ref 0.0–0.1)
Basophils Relative: 1 %
Eosinophils Absolute: 0.1 10*3/uL (ref 0.0–0.5)
Eosinophils Relative: 1 %
HCT: 42 % (ref 36.0–46.0)
Hemoglobin: 13.8 g/dL (ref 12.0–15.0)
Immature Granulocytes: 1 %
Lymphocytes Relative: 37 %
Lymphs Abs: 2.3 10*3/uL (ref 0.7–4.0)
MCH: 34 pg (ref 26.0–34.0)
MCHC: 32.9 g/dL (ref 30.0–36.0)
MCV: 103.4 fL — ABNORMAL HIGH (ref 80.0–100.0)
Monocytes Absolute: 0.6 10*3/uL (ref 0.1–1.0)
Monocytes Relative: 10 %
Neutro Abs: 3.2 10*3/uL (ref 1.7–7.7)
Neutrophils Relative %: 50 %
Platelets: 324 10*3/uL (ref 150–400)
RBC: 4.06 MIL/uL (ref 3.87–5.11)
RDW: 13.7 % (ref 11.5–15.5)
WBC: 6.2 10*3/uL (ref 4.0–10.5)
nRBC: 0 % (ref 0.0–0.2)

## 2019-08-15 LAB — SURGICAL PCR SCREEN
MRSA, PCR: NEGATIVE
Staphylococcus aureus: NEGATIVE

## 2019-08-15 LAB — APTT: aPTT: 31 seconds (ref 24–36)

## 2019-08-15 LAB — PROTIME-INR
INR: 1 (ref 0.8–1.2)
Prothrombin Time: 12.8 seconds (ref 11.4–15.2)

## 2019-08-15 NOTE — Progress Notes (Addendum)
PCP - Jonathon Bellows, MD Cardiologist - Denies  PPM/ICD - Denies  Chest x-ray - 03/10/19 EKG - 03/21/19 Stress Test - Denies ECHO - Denies Cardiac Cath - Denies  Sleep Study - Denies  Patient denies being a diabetic.  Blood Thinner Instructions: N/A Aspirin Instructions: N/A  ERAS Protcol - Yes PRE-SURGERY Ensure or G2- Ensure given  COVID TEST- DOS, per Jovita Kussmaul, RN. Patient does not have enough money for transportation to and from [place] testing site.   Anesthesia review: No  Patient denies shortness of breath, fever, cough and chest pain at PAT appointment   All instructions explained to the patient, with a verbal understanding of the material. Patient agrees to go over the instructions while at home for a better understanding. Patient also instructed to self quarantine after being tested for COVID-19. The opportunity to ask questions was provided.
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
## 2019-08-16 ENCOUNTER — Ambulatory Visit (HOSPITAL_COMMUNITY): Payer: Medicaid Other | Admitting: Anesthesiology

## 2019-08-16 ENCOUNTER — Ambulatory Visit (HOSPITAL_COMMUNITY)
Admission: RE | Disposition: A | Discharge status: Home / Self Care | Payer: Self-pay | Source: Home / Self Care | Attending: Orthopedic Surgery

## 2019-08-16 ENCOUNTER — Other Ambulatory Visit: Payer: Self-pay

## 2019-08-16 ENCOUNTER — Ambulatory Visit (HOSPITAL_COMMUNITY): Payer: Medicaid Other | Admitting: Physician Assistant

## 2019-08-16 ENCOUNTER — Encounter (HOSPITAL_COMMUNITY): Payer: Self-pay | Admitting: Orthopedic Surgery

## 2019-08-16 ENCOUNTER — Ambulatory Visit (HOSPITAL_COMMUNITY): Payer: Medicaid Other

## 2019-08-16 ENCOUNTER — Ambulatory Visit (HOSPITAL_COMMUNITY)
Admission: RE | Admit: 2019-08-16 | Discharge: 2019-08-17 | Disposition: A | Discharge status: Home / Self Care | Payer: Medicaid Other | Attending: Orthopedic Surgery | Admitting: Orthopedic Surgery

## 2019-08-16 DIAGNOSIS — Z419 Encounter for procedure for purposes other than remedying health state, unspecified: Secondary | ICD-10-CM

## 2019-08-16 DIAGNOSIS — F17210 Nicotine dependence, cigarettes, uncomplicated: Secondary | ICD-10-CM | POA: Insufficient documentation

## 2019-08-16 DIAGNOSIS — Z7951 Long term (current) use of inhaled steroids: Secondary | ICD-10-CM | POA: Insufficient documentation

## 2019-08-16 DIAGNOSIS — Z79899 Other long term (current) drug therapy: Secondary | ICD-10-CM | POA: Insufficient documentation

## 2019-08-16 DIAGNOSIS — J45909 Unspecified asthma, uncomplicated: Secondary | ICD-10-CM | POA: Insufficient documentation

## 2019-08-16 DIAGNOSIS — Z791 Long term (current) use of non-steroidal anti-inflammatories (NSAID): Secondary | ICD-10-CM | POA: Diagnosis not present

## 2019-08-16 DIAGNOSIS — Z20822 Contact with and (suspected) exposure to covid-19: Secondary | ICD-10-CM | POA: Insufficient documentation

## 2019-08-16 DIAGNOSIS — M5410 Radiculopathy, site unspecified: Secondary | ICD-10-CM | POA: Diagnosis present

## 2019-08-16 DIAGNOSIS — M5116 Intervertebral disc disorders with radiculopathy, lumbar region: Secondary | ICD-10-CM | POA: Diagnosis present

## 2019-08-16 HISTORY — PX: LUMBAR LAMINECTOMY/DECOMPRESSION MICRODISCECTOMY: SHX5026

## 2019-08-16 LAB — RESPIRATORY PANEL BY RT PCR (FLU A&B, COVID)
Influenza A by PCR: NEGATIVE
Influenza B by PCR: NEGATIVE
SARS Coronavirus 2 by RT PCR: NEGATIVE

## 2019-08-16 SURGERY — LUMBAR LAMINECTOMY/DECOMPRESSION MICRODISCECTOMY
Anesthesia: General | Laterality: Right

## 2019-08-16 MED ORDER — SODIUM CHLORIDE 0.9% FLUSH: 3.0000 mL | INTRAVENOUS | Status: DC | PRN

## 2019-08-16 MED ORDER — ACETAMINOPHEN 10 MG/ML IV SOLN: 1000.0000 mg | Freq: Once | INTRAVENOUS | Status: DC | PRN

## 2019-08-16 MED ORDER — POVIDONE-IODINE 7.5 % EX SOLN: Freq: Once | CUTANEOUS | Status: DC

## 2019-08-16 MED ORDER — THROMBIN 20000 UNITS EX KIT
PACK | CUTANEOUS | Status: DC | PRN
  Administered 2019-08-16: 20000 [IU] via TOPICAL

## 2019-08-16 MED ORDER — HEMOSTATIC AGENTS (NO CHARGE) OPTIME
TOPICAL | Status: DC | PRN
  Administered 2019-08-16: 1 via TOPICAL

## 2019-08-16 MED ORDER — ROCURONIUM BROMIDE 10 MG/ML (PF) SYRINGE
PREFILLED_SYRINGE | INTRAVENOUS | Status: DC | PRN
  Administered 2019-08-16: 80 mg via INTRAVENOUS

## 2019-08-16 MED ORDER — FENTANYL CITRATE (PF) 100 MCG/2ML IJ SOLN
25.0000 ug | INTRAMUSCULAR | Status: DC | PRN
  Administered 2019-08-16 (×2): 50 ug via INTRAVENOUS

## 2019-08-16 MED ORDER — BUPIVACAINE HCL (PF) 0.25 % IJ SOLN
INTRAMUSCULAR | Status: DC | PRN
  Administered 2019-08-16 (×2): 30 mL

## 2019-08-16 MED ORDER — ALBUTEROL SULFATE (2.5 MG/3ML) 0.083% IN NEBU: 2.5000 mg | INHALATION_SOLUTION | RESPIRATORY_TRACT | Status: DC | PRN

## 2019-08-16 MED ORDER — LORATADINE 10 MG PO TABS
10.0000 mg | ORAL_TABLET | Freq: Every day | ORAL | Status: DC
  Administered 2019-08-17: 09:00:00 10 mg via ORAL
  Filled 2019-08-16: qty 1

## 2019-08-16 MED ORDER — ACETAMINOPHEN 500 MG PO TABS: 1000.0000 mg | ORAL_TABLET | Freq: Once | ORAL | Status: DC | PRN

## 2019-08-16 MED ORDER — LIDOCAINE 2% (20 MG/ML) 5 ML SYRINGE
INTRAMUSCULAR | Status: DC | PRN
  Administered 2019-08-16: 60 mg via INTRAVENOUS

## 2019-08-16 MED ORDER — ONDANSETRON HCL 4 MG/2ML IJ SOLN
INTRAMUSCULAR | Status: DC | PRN
  Administered 2019-08-16: 4 mg via INTRAVENOUS

## 2019-08-16 MED ORDER — ALUM & MAG HYDROXIDE-SIMETH 200-200-20 MG/5ML PO SUSP: 30.0000 mL | Freq: Four times a day (QID) | ORAL | Status: DC | PRN

## 2019-08-16 MED ORDER — OXYCODONE-ACETAMINOPHEN 5-325 MG PO TABS
1.0000 | ORAL_TABLET | ORAL | Status: DC | PRN
  Administered 2019-08-16: 2 via ORAL
  Administered 2019-08-16: 1 via ORAL
  Administered 2019-08-17: 2 via ORAL
  Filled 2019-08-16 (×2): qty 2

## 2019-08-16 MED ORDER — ONDANSETRON HCL 4 MG PO TABS: 4.0000 mg | ORAL_TABLET | Freq: Four times a day (QID) | ORAL | Status: DC | PRN

## 2019-08-16 MED ORDER — BISACODYL 5 MG PO TBEC: 5.0000 mg | DELAYED_RELEASE_TABLET | Freq: Every day | ORAL | Status: DC | PRN

## 2019-08-16 MED ORDER — DEXAMETHASONE SODIUM PHOSPHATE 10 MG/ML IJ SOLN
INTRAMUSCULAR | Status: DC | PRN
  Administered 2019-08-16: 10 mg via INTRAVENOUS

## 2019-08-16 MED ORDER — KETAMINE HCL 50 MG/5ML IJ SOSY
PREFILLED_SYRINGE | INTRAMUSCULAR | Status: AC
  Filled 2019-08-16: qty 5

## 2019-08-16 MED ORDER — FENTANYL CITRATE (PF) 250 MCG/5ML IJ SOLN
INTRAMUSCULAR | Status: AC
  Filled 2019-08-16: qty 5

## 2019-08-16 MED ORDER — ACETAMINOPHEN 10 MG/ML IV SOLN
INTRAVENOUS | Status: DC | PRN
  Administered 2019-08-16: 1000 mg via INTRAVENOUS

## 2019-08-16 MED ORDER — PROPOFOL 10 MG/ML IV BOLUS
INTRAVENOUS | Status: AC
  Filled 2019-08-16: qty 20

## 2019-08-16 MED ORDER — BUPIVACAINE LIPOSOME 1.3 % IJ SUSP
INTRAMUSCULAR | Status: DC | PRN
  Administered 2019-08-16: 20 mL

## 2019-08-16 MED ORDER — FENTANYL CITRATE (PF) 100 MCG/2ML IJ SOLN
INTRAMUSCULAR | Status: AC
  Filled 2019-08-16: qty 2

## 2019-08-16 MED ORDER — FLEET ENEMA 7-19 GM/118ML RE ENEM: 1.0000 | ENEMA | Freq: Once | RECTAL | Status: DC | PRN

## 2019-08-16 MED ORDER — ONDANSETRON HCL 4 MG/2ML IJ SOLN: 4.0000 mg | Freq: Four times a day (QID) | INTRAMUSCULAR | Status: DC | PRN

## 2019-08-16 MED ORDER — ACETAMINOPHEN 10 MG/ML IV SOLN
INTRAVENOUS | Status: AC
  Filled 2019-08-16: qty 100

## 2019-08-16 MED ORDER — KETAMINE HCL 10 MG/ML IJ SOLN
INTRAMUSCULAR | Status: DC | PRN
  Administered 2019-08-16: 40 mg via INTRAVENOUS
  Administered 2019-08-16: 10 mg via INTRAVENOUS

## 2019-08-16 MED ORDER — ALBUTEROL SULFATE HFA 108 (90 BASE) MCG/ACT IN AERS
INHALATION_SPRAY | RESPIRATORY_TRACT | Status: AC
  Filled 2019-08-16: qty 6.7

## 2019-08-16 MED ORDER — DOCUSATE SODIUM 100 MG PO CAPS
100.0000 mg | ORAL_CAPSULE | Freq: Two times a day (BID) | ORAL | Status: DC
  Administered 2019-08-16 – 2019-08-17 (×2): 100 mg via ORAL
  Filled 2019-08-16 (×2): qty 1

## 2019-08-16 MED ORDER — ACETAMINOPHEN 160 MG/5ML PO SOLN: 1000.0000 mg | Freq: Once | ORAL | Status: DC | PRN

## 2019-08-16 MED ORDER — MOMETASONE FURO-FORMOTEROL FUM 100-5 MCG/ACT IN AERO
2.0000 | INHALATION_SPRAY | Freq: Two times a day (BID) | RESPIRATORY_TRACT | Status: DC
  Administered 2019-08-17: 2 via RESPIRATORY_TRACT
  Filled 2019-08-16: qty 8.8

## 2019-08-16 MED ORDER — LACTATED RINGERS IV SOLN: INTRAVENOUS | Status: DC

## 2019-08-16 MED ORDER — CEFAZOLIN SODIUM-DEXTROSE 2-4 GM/100ML-% IV SOLN
2.0000 g | INTRAVENOUS | Status: AC
  Administered 2019-08-16: 2 g via INTRAVENOUS

## 2019-08-16 MED ORDER — LIDOCAINE 2% (20 MG/ML) 5 ML SYRINGE
INTRAMUSCULAR | Status: AC
  Filled 2019-08-16: qty 5

## 2019-08-16 MED ORDER — MENTHOL 3 MG MT LOZG: 1.0000 | LOZENGE | OROMUCOSAL | Status: DC | PRN

## 2019-08-16 MED ORDER — BENZONATATE 100 MG PO CAPS
100.0000 mg | ORAL_CAPSULE | Freq: Three times a day (TID) | ORAL | Status: DC
  Administered 2019-08-16 – 2019-08-17 (×2): 100 mg via ORAL
  Filled 2019-08-16 (×2): qty 1

## 2019-08-16 MED ORDER — CEFAZOLIN SODIUM-DEXTROSE 2-4 GM/100ML-% IV SOLN
2.0000 g | Freq: Three times a day (TID) | INTRAVENOUS | Status: AC
  Administered 2019-08-16 – 2019-08-17 (×2): 2 g via INTRAVENOUS
  Filled 2019-08-16 (×2): qty 100

## 2019-08-16 MED ORDER — MIDAZOLAM HCL 5 MG/5ML IJ SOLN
INTRAMUSCULAR | Status: DC | PRN
  Administered 2019-08-16: 2 mg via INTRAVENOUS

## 2019-08-16 MED ORDER — SODIUM CHLORIDE 0.9% FLUSH
3.0000 mL | Freq: Two times a day (BID) | INTRAVENOUS | Status: DC
  Administered 2019-08-16: 23:00:00 3 mL via INTRAVENOUS

## 2019-08-16 MED ORDER — BUPIVACAINE HCL (PF) 0.25 % IJ SOLN
INTRAMUSCULAR | Status: AC
  Filled 2019-08-16: qty 60

## 2019-08-16 MED ORDER — KETOROLAC TROMETHAMINE 30 MG/ML IJ SOLN
INTRAMUSCULAR | Status: DC | PRN
  Administered 2019-08-16 (×2): 15 mg via INTRAVENOUS

## 2019-08-16 MED ORDER — KETOROLAC TROMETHAMINE 30 MG/ML IJ SOLN
INTRAMUSCULAR | Status: AC
  Filled 2019-08-16: qty 1

## 2019-08-16 MED ORDER — EPINEPHRINE PF 1 MG/ML IJ SOLN
INTRAMUSCULAR | Status: AC
  Filled 2019-08-16: qty 1

## 2019-08-16 MED ORDER — 0.9 % SODIUM CHLORIDE (POUR BTL) OPTIME
TOPICAL | Status: DC | PRN
  Administered 2019-08-16: 1000 mL

## 2019-08-16 MED ORDER — FENTANYL CITRATE (PF) 100 MCG/2ML IJ SOLN
INTRAMUSCULAR | Status: DC | PRN
  Administered 2019-08-16 (×7): 50 ug via INTRAVENOUS
  Administered 2019-08-16: 150 ug via INTRAVENOUS

## 2019-08-16 MED ORDER — MIDAZOLAM HCL 2 MG/2ML IJ SOLN
INTRAMUSCULAR | Status: AC
  Filled 2019-08-16: qty 2

## 2019-08-16 MED ORDER — METHYLENE BLUE 0.5 % INJ SOLN
INTRAVENOUS | Status: AC
  Filled 2019-08-16: qty 10

## 2019-08-16 MED ORDER — ACETAMINOPHEN 650 MG RE SUPP: 650.0000 mg | RECTAL | Status: DC | PRN

## 2019-08-16 MED ORDER — CEFAZOLIN SODIUM-DEXTROSE 2-4 GM/100ML-% IV SOLN
INTRAVENOUS | Status: AC
  Filled 2019-08-16: qty 100

## 2019-08-16 MED ORDER — SUGAMMADEX SODIUM 200 MG/2ML IV SOLN
INTRAVENOUS | Status: DC | PRN
  Administered 2019-08-16: 200 mg via INTRAVENOUS

## 2019-08-16 MED ORDER — METHYLENE BLUE 0.5 % INJ SOLN
INTRAVENOUS | Status: DC | PRN
  Administered 2019-08-16: 1 mL via SUBMUCOSAL

## 2019-08-16 MED ORDER — OXYCODONE-ACETAMINOPHEN 5-325 MG PO TABS
ORAL_TABLET | ORAL | Status: AC
  Filled 2019-08-16: qty 1

## 2019-08-16 MED ORDER — EPINEPHRINE PF 1 MG/ML IJ SOLN
INTRAMUSCULAR | Status: DC | PRN
  Administered 2019-08-16 (×2): .15 mL

## 2019-08-16 MED ORDER — CYCLOBENZAPRINE HCL 10 MG PO TABS
10.0000 mg | ORAL_TABLET | Freq: Three times a day (TID) | ORAL | Status: DC | PRN
  Administered 2019-08-16: 10 mg via ORAL

## 2019-08-16 MED ORDER — OXYCODONE HCL 5 MG/5ML PO SOLN: 5.0000 mg | Freq: Once | ORAL | Status: DC | PRN

## 2019-08-16 MED ORDER — OXYCODONE HCL 5 MG PO TABS: 5.0000 mg | ORAL_TABLET | Freq: Once | ORAL | Status: DC | PRN

## 2019-08-16 MED ORDER — SODIUM CHLORIDE 0.9 % IV SOLN
250.0000 mL | INTRAVENOUS | Status: DC
  Administered 2019-08-16: 250 mL via INTRAVENOUS

## 2019-08-16 MED ORDER — DICYCLOMINE HCL 10 MG PO CAPS
10.0000 mg | ORAL_CAPSULE | Freq: Three times a day (TID) | ORAL | Status: DC | PRN
  Filled 2019-08-16: qty 1

## 2019-08-16 MED ORDER — CYCLOBENZAPRINE HCL 10 MG PO TABS
ORAL_TABLET | ORAL | Status: AC
  Filled 2019-08-16: qty 1

## 2019-08-16 MED ORDER — SENNOSIDES-DOCUSATE SODIUM 8.6-50 MG PO TABS: 1.0000 | ORAL_TABLET | Freq: Every evening | ORAL | Status: DC | PRN

## 2019-08-16 MED ORDER — PROPOFOL 10 MG/ML IV BOLUS
INTRAVENOUS | Status: DC | PRN
  Administered 2019-08-16: 160 mg via INTRAVENOUS

## 2019-08-16 MED ORDER — METHYLPREDNISOLONE ACETATE 40 MG/ML IJ SUSP
INTRAMUSCULAR | Status: DC | PRN
  Administered 2019-08-16: 40 mg

## 2019-08-16 MED ORDER — ONDANSETRON HCL 4 MG/2ML IJ SOLN
INTRAMUSCULAR | Status: AC
  Filled 2019-08-16: qty 2

## 2019-08-16 MED ORDER — ACETAMINOPHEN 325 MG PO TABS: 650.0000 mg | ORAL_TABLET | ORAL | Status: DC | PRN

## 2019-08-16 MED ORDER — ZOLPIDEM TARTRATE 5 MG PO TABS
5.0000 mg | ORAL_TABLET | Freq: Every evening | ORAL | Status: DC | PRN
  Administered 2019-08-16: 5 mg via ORAL
  Filled 2019-08-16: qty 1

## 2019-08-16 MED ORDER — DEXAMETHASONE SODIUM PHOSPHATE 10 MG/ML IJ SOLN
INTRAMUSCULAR | Status: AC
  Filled 2019-08-16: qty 1

## 2019-08-16 MED ORDER — METHYLPREDNISOLONE ACETATE 40 MG/ML IJ SUSP
INTRAMUSCULAR | Status: AC
  Filled 2019-08-16: qty 1

## 2019-08-16 MED ORDER — PANTOPRAZOLE SODIUM 40 MG IV SOLR
40.0000 mg | Freq: Every day | INTRAVENOUS | Status: DC
  Administered 2019-08-16: 40 mg via INTRAVENOUS
  Filled 2019-08-16: qty 40

## 2019-08-16 MED ORDER — THROMBIN (RECOMBINANT) 20000 UNITS EX SOLR
CUTANEOUS | Status: AC
  Filled 2019-08-16: qty 20000

## 2019-08-16 MED ORDER — PHENOL 1.4 % MT LIQD: 1.0000 | OROMUCOSAL | Status: DC | PRN

## 2019-08-16 MED ORDER — ALBUTEROL SULFATE HFA 108 (90 BASE) MCG/ACT IN AERS
INHALATION_SPRAY | RESPIRATORY_TRACT | Status: DC | PRN
  Administered 2019-08-16: 5 via RESPIRATORY_TRACT
  Administered 2019-08-16: 2 via RESPIRATORY_TRACT

## 2019-08-16 SURGICAL SUPPLY — 74 items
BENZOIN TINCTURE PRP APPL 2/3 (GAUZE/BANDAGES/DRESSINGS) ×2 IMPLANT
BUR PRECISION FLUTE 5.0 (BURR) IMPLANT
BUR ROUND PRECISION 4.0 (BURR) ×2 IMPLANT
CABLE BIPOLOR RESECTION CORD (MISCELLANEOUS) IMPLANT
CANISTER SUCT 3000ML PPV (MISCELLANEOUS) ×2 IMPLANT
CARTRIDGE OIL MAESTRO DRILL (MISCELLANEOUS) ×1 IMPLANT
COVER SURGICAL LIGHT HANDLE (MISCELLANEOUS) ×2 IMPLANT
COVER WAND RF STERILE (DRAPES) IMPLANT
DIFFUSER DRILL AIR PNEUMATIC (MISCELLANEOUS) ×2 IMPLANT
DRAIN CHANNEL 15F RND FF W/TCR (WOUND CARE) IMPLANT
DRAPE POUCH INSTRU U-SHP 10X18 (DRAPES) ×2 IMPLANT
DRAPE SURG 17X23 STRL (DRAPES) ×8 IMPLANT
DRSG AQUACEL AG ADV 3.5X 4 (GAUZE/BANDAGES/DRESSINGS) ×2 IMPLANT
DURAPREP 26ML APPLICATOR (WOUND CARE) ×2 IMPLANT
ELECT BLADE 4.0 EZ CLEAN MEGAD (MISCELLANEOUS) ×2
ELECT CAUTERY BLADE 6.4 (BLADE) ×2 IMPLANT
ELECT REM PT RETURN 9FT ADLT (ELECTROSURGICAL) ×2
ELECTRODE BLDE 4.0 EZ CLN MEGD (MISCELLANEOUS) ×1 IMPLANT
ELECTRODE REM PT RTRN 9FT ADLT (ELECTROSURGICAL) ×1 IMPLANT
EVACUATOR SILICONE 100CC (DRAIN) IMPLANT
FILTER STRAW FLUID ASPIR (MISCELLANEOUS) ×4 IMPLANT
GAUZE 4X4 16PLY RFD (DISPOSABLE) IMPLANT
GAUZE SPONGE 4X4 12PLY STRL (GAUZE/BANDAGES/DRESSINGS) ×2 IMPLANT
GLOVE BIO SURGEON STRL SZ7 (GLOVE) ×4 IMPLANT
GLOVE BIO SURGEON STRL SZ8 (GLOVE) ×4 IMPLANT
GLOVE BIOGEL PI IND STRL 7.0 (GLOVE) ×1 IMPLANT
GLOVE BIOGEL PI IND STRL 8 (GLOVE) ×1 IMPLANT
GLOVE BIOGEL PI INDICATOR 7.0 (GLOVE) ×1
GLOVE BIOGEL PI INDICATOR 8 (GLOVE) ×1
GOWN STRL REUS W/ TWL LRG LVL3 (GOWN DISPOSABLE) ×1 IMPLANT
GOWN STRL REUS W/ TWL XL LVL3 (GOWN DISPOSABLE) ×2 IMPLANT
GOWN STRL REUS W/TWL LRG LVL3 (GOWN DISPOSABLE) ×1
GOWN STRL REUS W/TWL XL LVL3 (GOWN DISPOSABLE) ×2
IV CATH 14GX2 1/4 (CATHETERS) ×2 IMPLANT
KIT BASIN OR (CUSTOM PROCEDURE TRAY) ×2 IMPLANT
KIT POSITION SURG JACKSON T1 (MISCELLANEOUS) ×2 IMPLANT
KIT TURNOVER KIT B (KITS) ×2 IMPLANT
MARKER SKIN DUAL TIP RULER LAB (MISCELLANEOUS) ×4 IMPLANT
NEEDLE 18GX1X1/2 (RX/OR ONLY) (NEEDLE) ×2 IMPLANT
NEEDLE 22X1 1/2 (OR ONLY) (NEEDLE) ×2 IMPLANT
NEEDLE HYPO 25GX1X1/2 BEV (NEEDLE) ×2 IMPLANT
NEEDLE SPNL 18GX3.5 QUINCKE PK (NEEDLE) ×4 IMPLANT
NS IRRIG 1000ML POUR BTL (IV SOLUTION) ×2 IMPLANT
OIL CARTRIDGE MAESTRO DRILL (MISCELLANEOUS) ×2
PACK LAMINECTOMY ORTHO (CUSTOM PROCEDURE TRAY) ×2 IMPLANT
PACK UNIVERSAL I (CUSTOM PROCEDURE TRAY) ×2 IMPLANT
PAD ARMBOARD 7.5X6 YLW CONV (MISCELLANEOUS) IMPLANT
PATTIES SURGICAL .5 X.5 (GAUZE/BANDAGES/DRESSINGS) ×2 IMPLANT
PATTIES SURGICAL .5 X1 (DISPOSABLE) ×2 IMPLANT
SPONGE INTESTINAL PEANUT (DISPOSABLE) ×4 IMPLANT
SPONGE SURGIFOAM ABS GEL 100 (HEMOSTASIS) ×2 IMPLANT
SPONGE SURGIFOAM ABS GEL SZ50 (HEMOSTASIS) ×2 IMPLANT
STRIP CLOSURE SKIN 1/2X4 (GAUZE/BANDAGES/DRESSINGS) ×2 IMPLANT
SURGIFLO W/THROMBIN 8M KIT (HEMOSTASIS) ×2 IMPLANT
SUT BONE WAX W31G (SUTURE) ×2 IMPLANT
SUT MNCRL AB 4-0 PS2 18 (SUTURE) IMPLANT
SUT VIC AB 0 CT1 18XCR BRD 8 (SUTURE) ×1 IMPLANT
SUT VIC AB 0 CT1 27 (SUTURE)
SUT VIC AB 0 CT1 27XBRD ANBCTR (SUTURE) IMPLANT
SUT VIC AB 0 CT1 8-18 (SUTURE) ×1
SUT VIC AB 1 CT1 18XCR BRD 8 (SUTURE) ×1 IMPLANT
SUT VIC AB 1 CT1 8-18 (SUTURE) ×1
SUT VIC AB 2-0 CT2 18 VCP726D (SUTURE) ×2 IMPLANT
SUT VIC AB 4-0 PS2 18 (SUTURE) ×2 IMPLANT
SYR 20ML LL LF (SYRINGE) ×2 IMPLANT
SYR BULB IRRIGATION 50ML (SYRINGE) ×2 IMPLANT
SYR CONTROL 10ML LL (SYRINGE) ×4 IMPLANT
SYR TB 1ML 25GX5/8 (SYRINGE) ×4 IMPLANT
SYR TB 1ML LUER SLIP (SYRINGE) IMPLANT
TAPE CLOTH SURG 4X10 WHT LF (GAUZE/BANDAGES/DRESSINGS) ×2 IMPLANT
TOWEL GREEN STERILE (TOWEL DISPOSABLE) ×2 IMPLANT
TOWEL GREEN STERILE FF (TOWEL DISPOSABLE) ×2 IMPLANT
WATER STERILE IRR 1000ML POUR (IV SOLUTION) IMPLANT
YANKAUER SUCT BULB TIP NO VENT (SUCTIONS) ×2 IMPLANT

## 2019-08-16 NOTE — H&P (Signed)
PREOPERATIVE H&P  Chief Complaint: Right leg pain  HPI: Jessica Gallagher is a 44 y.o. 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 Anemia   . Anxiety   . Arthritis    right hip  . Asthma   . Depression   . Diverticulitis   . Dyspnea   . Fibroid tumor   . Fibromyalgia   . GERD (gastroesophageal reflux disease)   . PONV (postoperative nausea and vomiting)    nausea & vomiting after epidural   Past Surgical History:  Procedure Laterality Date  . ABDOMINAL HYSTERECTOMY  2020  . CHOLECYSTECTOMY     Social History   Socioeconomic History  . Marital status: Single    Spouse name: Not on file  . Number of children: Not on file  . Years of education: Not on file  . Highest education level: Not on file  Occupational History  . Not on file  Tobacco Use  . Smoking status: Current Every Day Smoker    Packs/day: 0.50    Types: Cigarettes  . Smokeless tobacco: Never Used  Substance and Sexual Activity  . Alcohol use: Yes    Comment: occasional  . Drug use: Yes    Types: Marijuana    Comment: daily  . Sexual activity: Yes    Birth control/protection: None  Other Topics Concern  . Not on file  Social History Narrative  . Not on file   Social Determinants of Health   Financial Resource Strain:   . Difficulty of Paying Living Expenses:   Food Insecurity:   . Worried About Charity fundraiser in the Last Year:   . Arboriculturist in the Last Year:   Transportation Needs:   . Film/video editor (Medical):   Marland Kitchen Lack of Transportation (Non-Medical):   Physical Activity:   . Days of Exercise per Week:   . Minutes of Exercise per Session:   Stress:   . Feeling of Stress :   Social Connections:   . Frequency  of Communication with Friends and Family:   . Frequency of Social Gatherings with Friends and Family:   . Attends Religious Services:   . Active Member of Clubs or Organizations:   . Attends Archivist Meetings:   Marland Kitchen Marital Status:    No family history on file. No Known Allergies Prior to Admission medications   Medication Sig Start Date End Date Taking? Authorizing Provider  acetaminophen (TYLENOL) 325 MG tablet Take 2 tablets (650 mg total) by mouth every 6 (six) hours as needed. 03/05/19  Yes Henderly, Britni A, PA-C  albuterol (VENTOLIN HFA) 108 (90 Base) MCG/ACT inhaler Inhale 1-2 puffs into the lungs every 4 (four) hours as needed for shortness of breath. 11/14/18  Yes [provider]  fluticasone-salmeterol (ADVAIR HFA) 45-21 MCG/ACT inhaler Inhale 2 puffs into the lungs 2 (two) times daily. 11/14/18  Yes [provider]  benzonatate (TESSALON) 100 MG capsule Take 1 capsule (100 mg total) by mouth every 8 (eight) hours. 08/03/19   Suzy Bouchard, PA-C  cetirizine (ZYRTEC ALLERGY) 10 MG tablet Take 1 tablet (10 mg total) by mouth daily. 08/03/19   Suzy Bouchard, PA-C  cyclobenzaprine (FLEXERIL) 10  MG tablet Take 1 tablet (10 mg total) by mouth 2 (two) times daily as needed for muscle spasms. 08/03/19   Suzy Bouchard, PA-C  dicyclomine (BENTYL) 10 MG capsule Take 10 mg by mouth 3 (three) times daily as needed. 08/18/18 09/17/18  [provider]  methocarbamol (ROBAXIN) 500 MG tablet Take 1 tablet (500 mg total) by mouth 2 (two) times daily. Patient not taking: Reported on 08/02/2019 06/29/17   Providence Lanius A, PA-C  naproxen (NAPROSYN) 500 MG tablet Take 1 tablet (500 mg total) by mouth 2 (two) times daily. 08/03/19   Suzy Bouchard, PA-C  predniSONE (STERAPRED UNI-PAK 21 TAB) 10 MG (21) TBPK tablet Take by mouth daily. Take 6 tabs by mouth daily  for 2 days, then 5 tabs for 2 days, then 4 tabs for 2 days, then 3 tabs for 2 days, 2 tabs for 2  days, then 1 tab by mouth daily for 2 days Patient not taking: Reported on 08/02/2019 06/13/19   Couture, Cortni S, PA-C  predniSONE (STERAPRED UNI-PAK 21 TAB) 10 MG (21) TBPK tablet Take by mouth daily. Take 6 tabs by mouth daily  for 1 days, then 5 tabs for 1 days, then 4 tabs for 1 days, then 3 tabs for 1 days, 2 tabs for 1 days, then 1 tab by mouth daily for 1 days 08/03/19   Suzy Bouchard, PA-C     All other systems have been reviewed and were otherwise negative with the exception of those mentioned in the HPI and as above.  Physical Exam: There were no vitals filed for this visit.  There is no height or weight on file to calculate BMI.  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

## 2019-08-16 NOTE — Progress Notes (Signed)
Called Social Worker, Kathlee Nations to notify of patient's arrival for surgery today.

## 2019-08-16 NOTE — Anesthesia Postprocedure Evaluation (Signed)
Anesthesia Post Note  Patient: Jessica Gallagher  Procedure(s) Performed: RIGHT-SIDED LUMBAR 4- LUMBAR 5 MICRODISECTOMY (Right )     Patient location during evaluation: PACU Anesthesia Type: General Level of consciousness: awake and alert Pain management: pain level controlled Vital Signs Assessment: post-procedure vital signs reviewed and stable Respiratory status: spontaneous breathing, nonlabored ventilation and respiratory function stable Cardiovascular status: blood pressure returned to baseline and stable Postop Assessment: no apparent nausea or vomiting Anesthetic complications: no    Last Vitals:  Vitals:   08/16/19 1624 08/16/19 1639  BP: (!) 132/110 116/67  Pulse:  87  Resp: 19 18  Temp: 37.1 C   SpO2: 98% 97%    Last Pain:  Vitals:   08/16/19 1154  TempSrc: Oral  PainSc:                  [name]

## 2019-08-16 NOTE — Anesthesia Preprocedure Evaluation (Addendum)
Anesthesia Evaluation  Patient identified by MRN, date of birth, ID band Patient awake    Reviewed: Allergy & Precautions, NPO status , Patient's Chart, lab work & pertinent test results  History of Anesthesia Complications (+) PONV and history of anesthetic complications  Airway Mallampati: II  TM Distance: >3 FB Neck ROM: Full    Dental  (+) Dental Advisory Given, Teeth Intact   Pulmonary shortness of breath, asthma , Current SmokerPatient did not abstain from smoking.,    breath sounds clear to auscultation       Cardiovascular negative cardio ROS   Rhythm:Regular     Neuro/Psych PSYCHIATRIC DISORDERS Anxiety Depression  Neuromuscular disease    GI/Hepatic Neg liver ROS, GERD  ,  Endo/Other  negative endocrine ROS  Renal/GU negative Renal ROS     Musculoskeletal  (+) Arthritis , Fibromyalgia -  Abdominal   Peds  Hematology negative hematology ROS (+)   Anesthesia Other Findings   Reproductive/Obstetrics                             Anesthesia Physical Anesthesia Plan  ASA: II  Anesthesia Plan: General   Post-op Pain Management:    Induction: Intravenous  PONV Risk Score and Plan: 3 and Ondansetron and Dexamethasone  Airway Management Planned: Oral ETT  Additional Equipment: None  Intra-op Plan:   Post-operative Plan: Extubation in OR  Informed Consent: I have reviewed the patients History and Physical, chart, labs and discussed the procedure including the risks, benefits and alternatives for the proposed anesthesia with the patient or authorized representative who has indicated his/her understanding and acceptance.     Dental advisory given  Plan Discussed with: CRNA and Surgeon  Anesthesia Plan Comments:         Anesthesia Quick Evaluation
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
## 2019-08-16 NOTE — Transfer of Care (Signed)
Immediate Anesthesia Transfer of Care Note  Patient: Jessica Gallagher  Procedure(s) Performed: RIGHT-SIDED LUMBAR 4- LUMBAR 5 MICRODISECTOMY (Right )  Patient Location: PACU  Anesthesia Type:General  Level of Consciousness: awake, oriented and patient cooperative  Airway & Oxygen Therapy: Patient Spontanous Breathing and Patient connected to nasal cannula oxygen  Post-op Assessment: Report given to RN and Post -op Vital signs reviewed and stable  Post vital signs: Reviewed  Last Vitals:  Vitals Value Taken Time  BP 132/110 08/16/19 1624  Temp    Pulse 117 08/16/19 1628  Resp 31 08/16/19 1629  SpO2 62 % 08/16/19 1628  Vitals shown include unvalidated device data.  Last Pain:  Vitals:   08/16/19 1154  TempSrc: Oral  PainSc:       Patients Stated Pain Goal: 7 ([id number])  Complications: No apparent anesthesia complications

## 2019-08-16 NOTE — Anesthesia Procedure Notes (Signed)
Procedure Name: Intubation Date/Time: 08/16/2019 1:26 PM Performed by: Jenne Campus, CRNA Pre-anesthesia Checklist: Patient identified, Emergency Drugs available, Suction available and Patient being monitored Patient Re-evaluated:Patient Re-evaluated prior to induction Oxygen Delivery Method: Circle System Utilized Preoxygenation: Pre-oxygenation with 100% oxygen Induction Type: IV induction Ventilation: Mask ventilation without difficulty Laryngoscope Size: Miller and 2 Grade View: Grade I Tube type: Oral Tube size: 7.5 mm Number of attempts: 1 Airway Equipment and Method: Stylet and Oral airway Placement Confirmation: ETT inserted through vocal cords under direct vision,  positive ETCO2 and breath sounds checked- equal and bilateral Secured at: 22 cm Tube secured with: Tape Dental Injury: Teeth and Oropharynx as per pre-operative assessment

## 2019-08-16 NOTE — Progress Notes (Signed)
Patient did not show up for her PAT appointment. Rescheduled for 08/15/19 @ 0800.

## 2019-08-16 NOTE — Progress Notes (Signed)
Lip rings removed and placed in pink denture cup and placed in patient's bag.

## 2019-08-16 NOTE — Op Note (Signed)
PATIENT NAME: Jessica Gallagher   MEDICAL RECORD NO.:   [id number]   DATE OF BIRTH: 1975/10/30   DATE OF PROCEDURE: 08/16/2019                               OPERATIVE REPORT     PREOPERATIVE DIAGNOSES: 1. Right-sided L5 radiculopathy. 2. 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   POSTOPERATIVE DIAGNOSES: 1. Right-sided L5 radiculopathy. 2. 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   PROCEDURES:  Right-sided L4/5 laminotomy with partial facetectomy and removal of herniated right-sided L4/5 disk fragment.   SURGEON:  Phylliss Bob, MD.   ASSISTANTPricilla Holm, PA-C.   ANESTHESIA:  General endotracheal anesthesia.   COMPLICATIONS:  None.   DISPOSITION:  Stable.   ESTIMATED BLOOD LOSS:  Minimal.   INDICATIONS FOR SURGERY:  Briefly, [name] is a pleasant 44 year old female, who did present to me with severe pain and weakness in the right  leg.  The patient's MRI did reveal the findings outlined above.  As such, we did ultimately elect to proceed with the procedure reflected above.  The patient was fully made aware of the risks of surgery, including the risk of recurrent herniation and the need for subsequent surgery, including the possibility of a subsequent diskectomy and/or fusion.   OPERATIVE DETAILS:  On 08/16/2019, the patient was brought to surgery and general endotracheal anesthesia was administered.  The patient was placed prone on a well-padded flat Jackson bed with a spinal frame.  Antibiotics were given.  The back was prepped and draped and a time-out procedure was performed.  At this point, a midline incision was made directly over the L4/5 intervertebral space.  A curvilinear incision was made just to the right of the midline into the fascia.  A self-retaining McCulloch retractor was placed.  The lamina of L4 and L5 was identified and subperiosteally exposed.  I then removed the  lateral aspect of the L4/5 ligamentum flavum.  Readily identified was the traversing right L5 nerve, which was noted to be under obvious tension, and was noted to be rather erythematous.  I was able to gently gain medial retraction of the nerve, and in doing so, a large herniated disk fragment was readily noted, which was migrated inferiorly, behind the L5 vertebral body.   The disc herniation was uneventfully removed in multiple fragments.   At this point, the traversing right L5 nerve was evaluated, and was noted to be free and mobile, and entirely free of any compression. I was very pleased with the final decompression that I was able to accomplish.  At this point, the wound was copiously irrigated with normal saline.  All epidural bleeding was controlled using bipolar electrocautery in addition to Surgiflo. All bleeding was controlled at the termination of the procedure.  At this point, 30 mg of Depo-Medrol was introduced about the epidural space in the region of the left L5 nerve.  The wound was then closed in layers using #1 Vicryl followed by 2-0 Vicryl, followed by 4-0 Monocryl. Benzoin and Steri-Strips were applied followed by a sterile dressing. All instrument counts were correct at the termination of the procedure.   Of note, Pricilla Holm was my assistant throughout surgery, and did aid in retraction, suctioning, and closure from start to finish.     Phylliss Bob, MD

## 2019-08-17 DIAGNOSIS — M5116 Intervertebral disc disorders with radiculopathy, lumbar region: Secondary | ICD-10-CM | POA: Diagnosis not present

## 2019-08-17 MED ORDER — CYCLOBENZAPRINE HCL 10 MG PO TABS: 10.0000 mg | ORAL_TABLET | Freq: Three times a day (TID) | ORAL | 0 refills | Status: AC | PRN

## 2019-08-17 MED ORDER — OXYCODONE-ACETAMINOPHEN 5-325 MG PO TABS: 1.0000 | ORAL_TABLET | ORAL | 0 refills | Status: AC | PRN

## 2019-08-17 MED FILL — Thrombin (Recombinant) For Soln 20000 Unit: CUTANEOUS | Qty: 1 | Status: AC

## 2019-08-17 MED FILL — OXYCODONE-APAP 5-325MG: 5-325 | 10 days supply | Qty: 40 | Fill #0

## 2019-08-17 MED FILL — CYCLOBENZAPRINE HCL 5 MG TA: 5 | 10 days supply | Qty: 60 | Fill #0

## 2019-08-17 NOTE — Progress Notes (Signed)
    Patient doing well Patient reports resolved right leg pain Has been ambulating   Physical Exam: Vitals:   08/17/19 0007 08/17/19 0356  BP: (!) 123/59 (!) 116/50  Pulse: 74 (!) 58  Resp:    Temp: 98.2 F (36.8 C) 98.6 F (37 C)  SpO2: 99%     Dressing in place NVI  POD #1 s/p L4/5 decompression, doing very well  - up with PT/OT, encourage ambulation - Percocet for pain, Robaxin for muscle spasms - d/c home today with f/u in 2 weeks

## 2019-08-17 NOTE — Plan of Care (Signed)

## 2019-08-17 NOTE — Progress Notes (Signed)
Patient is discharged to the hotel on self. Discharge instruction given to patient. Patient would not wait to get her medication, she preferred sitting outside because her anxiety is high. She spoke to the Education officer, museum and is going to sit in front of the main entrance waiting on her medication.
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
